# Patient Record
Sex: Female | Born: 1955 | Race: Asian | Hispanic: No | Marital: Married | Smoking: Never smoker
Health system: Southern US, Community
[De-identification: ages and names within clinical notes are randomized; demographics above are authoritative.]

## PROBLEM LIST (undated history)

## (undated) DIAGNOSIS — E237 Disorder of pituitary gland, unspecified: Secondary | ICD-10-CM

## (undated) DIAGNOSIS — E042 Nontoxic multinodular goiter: Secondary | ICD-10-CM

## (undated) DIAGNOSIS — K589 Irritable bowel syndrome without diarrhea: Secondary | ICD-10-CM

## (undated) DIAGNOSIS — B9681 Helicobacter pylori [H. pylori] as the cause of diseases classified elsewhere: Secondary | ICD-10-CM

## (undated) DIAGNOSIS — K2970 Gastritis, unspecified, without bleeding: Secondary | ICD-10-CM

## (undated) HISTORY — DX: Gastritis, unspecified, without bleeding: K29.70

## (undated) HISTORY — PX: APPENDECTOMY: SHX54

## (undated) HISTORY — DX: Disorder of pituitary gland, unspecified: E23.7

## (undated) HISTORY — DX: Irritable bowel syndrome, unspecified: K58.9

## (undated) HISTORY — DX: Nontoxic multinodular goiter: E04.2

## (undated) HISTORY — DX: Helicobacter pylori (H. pylori) as the cause of diseases classified elsewhere: B96.81

---

## 1988-07-24 DIAGNOSIS — E237 Disorder of pituitary gland, unspecified: Secondary | ICD-10-CM

## 1988-07-24 HISTORY — DX: Disorder of pituitary gland, unspecified: E23.7

## 1998-11-24 ENCOUNTER — Ambulatory Visit (HOSPITAL_COMMUNITY): Admission: RE | Admit: 1998-11-24 | Discharge: 1998-11-24 | Payer: Self-pay | Admitting: Gastroenterology

## 1999-07-25 DIAGNOSIS — B9681 Helicobacter pylori [H. pylori] as the cause of diseases classified elsewhere: Secondary | ICD-10-CM

## 1999-07-25 DIAGNOSIS — E042 Nontoxic multinodular goiter: Secondary | ICD-10-CM

## 1999-07-25 HISTORY — DX: Helicobacter pylori (H. pylori) as the cause of diseases classified elsewhere: B96.81

## 1999-07-25 HISTORY — DX: Nontoxic multinodular goiter: E04.2

## 1999-08-08 ENCOUNTER — Other Ambulatory Visit: Admission: RE | Admit: 1999-08-08 | Discharge: 1999-08-08 | Payer: Self-pay | Admitting: Obstetrics and Gynecology

## 1999-09-20 ENCOUNTER — Ambulatory Visit (HOSPITAL_COMMUNITY): Admission: RE | Admit: 1999-09-20 | Discharge: 1999-09-20 | Payer: Self-pay | Admitting: *Deleted

## 1999-10-06 ENCOUNTER — Ambulatory Visit (HOSPITAL_COMMUNITY): Admission: RE | Admit: 1999-10-06 | Discharge: 1999-10-06 | Payer: Self-pay | Admitting: *Deleted

## 1999-10-06 ENCOUNTER — Encounter (INDEPENDENT_AMBULATORY_CARE_PROVIDER_SITE_OTHER): Payer: Self-pay | Admitting: *Deleted

## 2000-07-24 HISTORY — PX: THYROIDECTOMY: SHX17

## 2000-10-26 ENCOUNTER — Other Ambulatory Visit: Admission: RE | Admit: 2000-10-26 | Discharge: 2000-10-26 | Payer: Self-pay | Admitting: Obstetrics and Gynecology

## 2000-12-24 ENCOUNTER — Encounter: Payer: Self-pay | Admitting: Surgery

## 2000-12-25 ENCOUNTER — Encounter (INDEPENDENT_AMBULATORY_CARE_PROVIDER_SITE_OTHER): Payer: Self-pay

## 2000-12-25 ENCOUNTER — Inpatient Hospital Stay (HOSPITAL_COMMUNITY): Admission: RE | Admit: 2000-12-25 | Discharge: 2000-12-26 | Payer: Self-pay | Admitting: Surgery

## 2001-03-26 ENCOUNTER — Ambulatory Visit (HOSPITAL_COMMUNITY): Admission: RE | Admit: 2001-03-26 | Discharge: 2001-03-26 | Payer: Self-pay | Admitting: Internal Medicine

## 2001-03-26 ENCOUNTER — Encounter: Payer: Self-pay | Admitting: Internal Medicine

## 2001-10-28 ENCOUNTER — Other Ambulatory Visit: Admission: RE | Admit: 2001-10-28 | Discharge: 2001-10-28 | Payer: Self-pay | Admitting: Obstetrics and Gynecology

## 2001-11-04 ENCOUNTER — Encounter: Payer: Self-pay | Admitting: Obstetrics and Gynecology

## 2001-11-04 ENCOUNTER — Ambulatory Visit (HOSPITAL_COMMUNITY): Admission: RE | Admit: 2001-11-04 | Discharge: 2001-11-04 | Payer: Self-pay | Admitting: Obstetrics and Gynecology

## 2004-07-07 ENCOUNTER — Ambulatory Visit (HOSPITAL_COMMUNITY): Admission: RE | Admit: 2004-07-07 | Discharge: 2004-07-07 | Payer: Self-pay | Admitting: Obstetrics and Gynecology

## 2005-05-29 ENCOUNTER — Ambulatory Visit: Payer: Self-pay | Admitting: Internal Medicine

## 2005-12-13 ENCOUNTER — Ambulatory Visit: Payer: Self-pay | Admitting: Internal Medicine

## 2005-12-21 ENCOUNTER — Ambulatory Visit: Payer: Self-pay | Admitting: Internal Medicine

## 2006-07-26 ENCOUNTER — Ambulatory Visit: Payer: Self-pay | Admitting: Internal Medicine

## 2006-07-26 LAB — CONVERTED CEMR LAB: TSH: 0.04 microintl units/mL — ABNORMAL LOW (ref 0.35–5.50)

## 2007-03-04 ENCOUNTER — Ambulatory Visit: Payer: Self-pay | Admitting: Internal Medicine

## 2007-03-04 DIAGNOSIS — Z862 Personal history of diseases of the blood and blood-forming organs and certain disorders involving the immune mechanism: Secondary | ICD-10-CM

## 2007-03-04 DIAGNOSIS — Z8639 Personal history of other endocrine, nutritional and metabolic disease: Secondary | ICD-10-CM

## 2007-03-06 ENCOUNTER — Encounter: Payer: Self-pay | Admitting: Internal Medicine

## 2007-03-06 LAB — CONVERTED CEMR LAB: TSH: 0.03 microintl units/mL — ABNORMAL LOW (ref 0.35–5.50)

## 2007-03-07 ENCOUNTER — Encounter (INDEPENDENT_AMBULATORY_CARE_PROVIDER_SITE_OTHER): Payer: Self-pay | Admitting: *Deleted

## 2007-08-01 ENCOUNTER — Telehealth (INDEPENDENT_AMBULATORY_CARE_PROVIDER_SITE_OTHER): Payer: Self-pay | Admitting: *Deleted

## 2007-10-10 ENCOUNTER — Ambulatory Visit: Payer: Self-pay | Admitting: Internal Medicine

## 2007-10-10 DIAGNOSIS — K589 Irritable bowel syndrome without diarrhea: Secondary | ICD-10-CM | POA: Insufficient documentation

## 2007-10-11 ENCOUNTER — Encounter (INDEPENDENT_AMBULATORY_CARE_PROVIDER_SITE_OTHER): Payer: Self-pay | Admitting: *Deleted

## 2007-10-11 ENCOUNTER — Telehealth (INDEPENDENT_AMBULATORY_CARE_PROVIDER_SITE_OTHER): Payer: Self-pay | Admitting: *Deleted

## 2007-10-14 ENCOUNTER — Telehealth (INDEPENDENT_AMBULATORY_CARE_PROVIDER_SITE_OTHER): Payer: Self-pay | Admitting: *Deleted

## 2007-10-15 ENCOUNTER — Encounter (INDEPENDENT_AMBULATORY_CARE_PROVIDER_SITE_OTHER): Payer: Self-pay | Admitting: *Deleted

## 2007-10-15 ENCOUNTER — Ambulatory Visit: Payer: Self-pay | Admitting: Internal Medicine

## 2007-10-15 LAB — CONVERTED CEMR LAB
OCCULT 1: NEGATIVE
OCCULT 2: NEGATIVE
OCCULT 3: NEGATIVE

## 2008-07-24 HISTORY — PX: COLONOSCOPY W/ POLYPECTOMY: SHX1380

## 2009-03-04 ENCOUNTER — Ambulatory Visit: Payer: Self-pay | Admitting: Internal Medicine

## 2009-03-04 ENCOUNTER — Encounter (INDEPENDENT_AMBULATORY_CARE_PROVIDER_SITE_OTHER): Payer: Self-pay | Admitting: *Deleted

## 2009-03-04 DIAGNOSIS — N959 Unspecified menopausal and perimenopausal disorder: Secondary | ICD-10-CM | POA: Insufficient documentation

## 2009-03-04 DIAGNOSIS — E236 Other disorders of pituitary gland: Secondary | ICD-10-CM | POA: Insufficient documentation

## 2009-03-06 LAB — CONVERTED CEMR LAB: Vit D, 25-Hydroxy: 22 ng/mL — ABNORMAL LOW (ref 30–89)

## 2009-03-10 ENCOUNTER — Telehealth (INDEPENDENT_AMBULATORY_CARE_PROVIDER_SITE_OTHER): Payer: Self-pay | Admitting: *Deleted

## 2009-03-10 ENCOUNTER — Encounter (INDEPENDENT_AMBULATORY_CARE_PROVIDER_SITE_OTHER): Payer: Self-pay | Admitting: *Deleted

## 2009-03-12 ENCOUNTER — Ambulatory Visit: Payer: Self-pay | Admitting: Internal Medicine

## 2009-03-17 ENCOUNTER — Ambulatory Visit: Payer: Self-pay | Admitting: Internal Medicine

## 2009-03-17 LAB — CONVERTED CEMR LAB: OCCULT 1: NEGATIVE

## 2009-03-18 ENCOUNTER — Encounter (INDEPENDENT_AMBULATORY_CARE_PROVIDER_SITE_OTHER): Payer: Self-pay | Admitting: *Deleted

## 2009-07-13 ENCOUNTER — Ambulatory Visit: Payer: Self-pay | Admitting: Internal Medicine

## 2009-07-14 ENCOUNTER — Encounter (INDEPENDENT_AMBULATORY_CARE_PROVIDER_SITE_OTHER): Payer: Self-pay | Admitting: *Deleted

## 2009-07-14 ENCOUNTER — Telehealth (INDEPENDENT_AMBULATORY_CARE_PROVIDER_SITE_OTHER): Payer: Self-pay | Admitting: *Deleted

## 2009-07-14 LAB — CONVERTED CEMR LAB: TSH: 0.02 microintl units/mL — ABNORMAL LOW (ref 0.35–5.50)

## 2010-02-14 ENCOUNTER — Encounter (INDEPENDENT_AMBULATORY_CARE_PROVIDER_SITE_OTHER): Payer: Self-pay | Admitting: *Deleted

## 2010-03-09 ENCOUNTER — Telehealth (INDEPENDENT_AMBULATORY_CARE_PROVIDER_SITE_OTHER): Payer: Self-pay | Admitting: *Deleted

## 2010-03-10 ENCOUNTER — Ambulatory Visit: Payer: Self-pay | Admitting: Internal Medicine

## 2010-03-17 ENCOUNTER — Ambulatory Visit: Payer: Self-pay | Admitting: Internal Medicine

## 2010-03-17 DIAGNOSIS — E559 Vitamin D deficiency, unspecified: Secondary | ICD-10-CM | POA: Insufficient documentation

## 2010-03-17 DIAGNOSIS — M8580 Other specified disorders of bone density and structure, unspecified site: Secondary | ICD-10-CM

## 2010-03-17 DIAGNOSIS — Z86010 Personal history of colonic polyps: Secondary | ICD-10-CM

## 2010-03-17 DIAGNOSIS — E039 Hypothyroidism, unspecified: Secondary | ICD-10-CM | POA: Insufficient documentation

## 2010-03-18 LAB — CONVERTED CEMR LAB
BUN: 13 mg/dL (ref 6–23)
CO2: 30 meq/L (ref 19–32)
Chloride: 102 meq/L (ref 96–112)
Creatinine, Ser: 0.6 mg/dL (ref 0.4–1.2)
Potassium: 4.4 meq/L (ref 3.5–5.1)
Vit D, 25-Hydroxy: 69 ng/mL (ref 30–89)

## 2010-06-14 ENCOUNTER — Ambulatory Visit: Payer: Self-pay | Admitting: Internal Medicine

## 2010-06-20 LAB — CONVERTED CEMR LAB
Cholesterol: 230 mg/dL — ABNORMAL HIGH (ref 0–200)
HDL: 55.5 mg/dL (ref >39.00)
Total CHOL/HDL Ratio: 4
VLDL: 39 mg/dL (ref 0.0–40.0)
Vit D, 25-Hydroxy: 62 ng/mL (ref 30–89)

## 2010-08-21 LAB — CONVERTED CEMR LAB
ALT: 14 units/L (ref 0–35)
AST: 18 units/L (ref 0–37)
Albumin: 4.3 g/dL (ref 3.5–5.2)
Alkaline Phosphatase: 80 units/L (ref 39–117)
BUN: 9 mg/dL (ref 6–23)
Basophils Absolute: 0 10*3/uL (ref 0.0–0.1)
Basophils Relative: 0.5 % (ref 0.0–1.0)
Bilirubin, Direct: 0.1 mg/dL (ref 0.0–0.3)
CO2: 32 meq/L (ref 19–32)
Calcium: 9.4 mg/dL (ref 8.4–10.5)
Chloride: 103 meq/L (ref 96–112)
Cholesterol: 203 mg/dL (ref 0–200)
Creatinine, Ser: 0.7 mg/dL (ref 0.4–1.2)
Direct LDL: 112.8 mg/dL
Eosinophils Absolute: 0 10*3/uL (ref 0.0–0.6)
Eosinophils Relative: 0.8 % (ref 0.0–5.0)
GFR calc Af Amer: 113 mL/min
GFR calc non Af Amer: 93 mL/min
Glucose, Bld: 91 mg/dL (ref 70–99)
HCT: 43.7 % (ref 36.0–46.0)
HDL: 57.5 mg/dL (ref >39.0)
Hemoglobin: 14.3 g/dL (ref 12.0–15.0)
Lymphocytes Relative: 35.8 % (ref 12.0–46.0)
MCHC: 32.8 g/dL (ref 30.0–36.0)
MCV: 94.1 fL (ref 78.0–100.0)
Monocytes Absolute: 0.4 10*3/uL (ref 0.2–0.7)
Monocytes Relative: 6.3 % (ref 3.0–11.0)
Neutro Abs: 3.5 10*3/uL (ref 1.4–7.7)
Neutrophils Relative %: 56.6 % (ref 43.0–77.0)
Platelets: 215 10*3/uL (ref 150–400)
Potassium: 3.9 meq/L (ref 3.5–5.1)
RBC: 4.65 M/uL (ref 3.87–5.11)
RDW: 12.1 % (ref 11.5–14.6)
Sodium: 141 meq/L (ref 135–145)
TSH: 0.69 microintl units/mL (ref 0.35–5.50)
Total Bilirubin: 0.8 mg/dL (ref 0.3–1.2)
Total CHOL/HDL Ratio: 3.5
Total Protein: 7.3 g/dL (ref 6.0–8.3)
Triglycerides: 145 mg/dL (ref 0–149)
VLDL: 29 mg/dL (ref 0–40)
WBC: 6.1 10*3/uL (ref 4.5–10.5)

## 2010-08-25 NOTE — Progress Notes (Signed)
Summary: Labs  Phone Note Call from Patient Call back at Home Phone [phone]   Summary of Call: Patient son called and scheduled TSH testing in the lab at 9 am tomorrow. Are there any other labs I should add on for this patient? Please advise.  Initial call taken by: Lucious Groves CMA,  March 09, 2010 3:31 PM  Follow-up for Phone Call        vit D level, CBC & dif, BMET, lipids, hepatic panel: 244.9,995.20, IBS,268.9 Follow-up by: Marga Melnick MD,  March 10, 2010 1:26 PM  Additional Follow-up for Phone Call Additional follow up Details #1::        Rene Kocher told me that the patient was already gone by the time phone note was answered. Would you like for me to have the patient come back or do you prefer to draw at next office visit Please advise. Additional Follow-up by: Lucious Groves CMA,  March 11, 2010 8:47 AM    Additional Follow-up for Phone Call Additional follow up Details #2::    can be drawn @ next visit Follow-up by: Marga Melnick MD,  March 11, 2010 12:53 PM

## 2010-08-25 NOTE — Assessment & Plan Note (Signed)
Summary: REVIEW MEDS//[facility]   Vital Signs:  Patient profile:   55 year old female Weight:      129.4 pounds BMI:     23.01 Pulse rate:   60 / minute Resp:     12 per minute BP sitting:   104 / 62  (left arm) Cuff size:   regular  Vitals Entered By: Shonna Chock CMA (March 17, 2010 9:09 AM) CC: 1.) Follow-up on labs  2.) Leg concerns x 1 month   CC:  1.) Follow-up on labs  2.) Leg concerns x 1 month.  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 Risk for Osteopenia also discussed. BMD 02/2009 reviewed : T score -1.2 @ R femoral neck. Vit D level was 22. She is on Calcium 1500 mg daily & vit D 100,000 International Units once daily . No fractures to date. Walking 3-4X/week for 30-40 min. Labs 09/2007 reviewed ; no significant abnormalities (HDL 57).  Current Medications (verified): 1)  Levoxyl 100 Mcg  Tabs (Levothyroxine Sodium) .Marland Kitchen.. 1 By Mouth Once Daily Except 1 & 1//2 Tab On Tues,th ,& Sat **appointment Due** 2)  Librax 2.5-5 Mg  Caps (Clidinium-Chlordiazepoxide) .... As Needed Only 3)  Vitamin D3 50000 Unit Caps (Cholecalciferol) .Marland Kitchen.. 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 Eyes:  Denies blurring, double vision, and vision loss-both eyes. ENT:  Denies difficulty swallowing and hoarseness. CV:  Denies palpitations. GI:  Denies abdominal  pain, bloody stools, constipation, dark tarry stools, and diarrhea. MS:  Complains of joint pain; denies joint redness, joint swelling, low back pain, mid back pain, and thoracic pain; knees hurt ; no Rx. Derm:  Complains of hair loss; denies changes in nail beds and dryness. Neuro:  Denies numbness and tingling. Endo:  Denies cold intolerance and heat intolerance; Mammograms done in [place].  Physical Exam  General:  Thin but well-nourished; alert,appropriate and cooperative throughout examination Eyes:  No corneal or conjunctival inflammation noted.No lid lag Neck:  No deformities, masses, or tenderness noted. Post op changes w/o nodules Lungs:  Normal respiratory effort, chest expands symmetrically. Lungs are clear to auscultation, no crackles or wheezes. Heart:  regular rhythm, no murmur, no gallop, no rub, no JVD, no HJR, and bradycardia.   Pulses:  R and L carotid,radial,dorsalis pedis and posterior tibial pulses are full and equal bilaterally Extremities:  No clubbing, cyanosis, edema. Single nail deformity  RUE. No knee effusion or crepitus Neurologic:  alert ; DTRs symmetrical and normal.  No tremor Skin:  Intact without suspicious lesions or rashes Cervical Nodes:  No lymphadenopathy noted Axillary Nodes:  No palpable lymphadenopathy Psych:  normally interactive, good eye contact, and not anxious appearing.     Impression & Recommendations:  Problem # 1:  HYPOTHYROIDISM (ICD-244.9)  S/P thyroidectomy for Hurthle Cell changes Her updated medication list for this problem includes:    Levoxyl 100 Mcg Tabs (Levothyroxine  sodium) .Marland Kitchen... 1 by mouth once daily except 1 & 1//2 tab on tues & sat  Orders: Venipuncture (04540) TLB-BMP (Basic Metabolic Panel-BMET) (80048-METABOL)  Problem # 2:  OSTEOPENIA (ICD-733.90)  Orders: Venipuncture (98119) TLB-BMP (Basic Metabolic Panel-BMET) (80048-METABOL) T-Vitamin D (25-Hydroxy) (14782-95621)  Problem # 3:  VITAMIN D DEFICIENCY  (ICD-268.9)  Orders: Venipuncture (30865) T-Vitamin D (25-Hydroxy) (78469-62952)  Complete Medication List: 1)  Levoxyl 100 Mcg Tabs (Levothyroxine sodium) .Marland Kitchen.. 1 by mouth once daily except 1 & 1//2 tab on tues & sat 2)  Librax 2.5-5 Mg Caps (Clidinium-chlordiazepoxide) .... As needed only 3)  Vitamin D3 50000 Unit Caps (cholecalciferol)  .Marland Kitchen.. 1 pill once weekly  Patient Instructions: 1)   BMD every 25 months .Please schedule a follow-up appointment in 3 months.Vit D level ,268.9. 2)  Lipid Panel prior to visit, ICD-9:244.9 3)  TSH prior to visit, ICD-9:244.9 Prescriptions: LEVOXYL 100 MCG  TABS (LEVOTHYROXINE SODIUM) 1 by mouth once daily except 1 & 1//2 tab on Tues & Sat  #90 x 1   Entered and Authorized by:   Marga Melnick MD   Signed by:   Marga Melnick MD on 03/17/2010   Method used:   Print then Give to Patient   RxID:   [id number]   Appended Document: REVIEW MEDS//[facility]

## 2010-08-25 NOTE — Letter (Signed)
Summary: Primary Care Appointment Letter  [facility] [place]  [address] [place], [place] [address]   Phone: [phone]  Fax: [phone]    02/14/2010 MRN: [id number]  [name] [address] [place], [place]  [address]  Dear Ms. Waylan Boga,   Your Primary Care Physician Marga Melnick MD has indicated that:    _______it is time to schedule an appointment.    _______you missed your appointment on______ and need to call and          reschedule.    _______you need to have lab work done.    _____X__you need to schedule an appointment discuss lab or test results (TSH is too low ; please see me with all medications).    _______you need to call to reschedule your appointment that is                       scheduled on _________.     Please call our office as soon as possible. Our phone number is [phone]. Please press option 0. Our office is open 8a-5p, Monday through Friday.     Thank you,    [facility]

## 2010-10-10 ENCOUNTER — Telehealth: Payer: Self-pay

## 2010-10-20 NOTE — Progress Notes (Signed)
Summary: Levoxyl refill  Phone Note Refill Request Message from:  Patient's son Gwenith Spitz = [phone]  Refills Requested: Medication #1:  LEVOXYL 100 MCG  TABS 1 by mouth once daily except 1 & 1//2 tab on Tues & Sat [facility] [place] or [place], [place]     Next Appointment Scheduled: 5/17    = CPX  ==   [place] Initial call taken by: Jerolyn Shin,  October 10, 2010 3:25 PM    Prescriptions: LEVOXYL 100 MCG  TABS (LEVOTHYROXINE SODIUM) 1 by mouth once daily except 1 & 1//2 tab on Tues & Sat  #90 Tablet x 0   Entered by:   Shonna Chock CMA   Authorized by:   Marga Melnick MD   Signed by:   Shonna Chock CMA on 10/10/2010   Method used:   [facility]        [facility]  [place] (retail)       [address]       [place], [place]  [address]       Ph: [phone] or [phone]       Fax: [phone]   RxID:   [id number]

## 2010-12-06 ENCOUNTER — Encounter: Payer: Self-pay | Admitting: Internal Medicine

## 2010-12-08 ENCOUNTER — Ambulatory Visit (INDEPENDENT_AMBULATORY_CARE_PROVIDER_SITE_OTHER): Payer: Self-pay | Admitting: Internal Medicine

## 2010-12-08 ENCOUNTER — Encounter: Payer: Self-pay | Admitting: Internal Medicine

## 2010-12-08 DIAGNOSIS — K589 Irritable bowel syndrome without diarrhea: Secondary | ICD-10-CM

## 2010-12-08 DIAGNOSIS — Z860100 Personal history of colon polyps, unspecified: Secondary | ICD-10-CM

## 2010-12-08 DIAGNOSIS — E559 Vitamin D deficiency, unspecified: Secondary | ICD-10-CM

## 2010-12-08 DIAGNOSIS — M8580 Other specified disorders of bone density and structure, unspecified site: Secondary | ICD-10-CM

## 2010-12-08 DIAGNOSIS — E039 Hypothyroidism, unspecified: Secondary | ICD-10-CM

## 2010-12-08 DIAGNOSIS — Z8639 Personal history of other endocrine, nutritional and metabolic disease: Secondary | ICD-10-CM

## 2010-12-08 DIAGNOSIS — Z862 Personal history of diseases of the blood and blood-forming organs and certain disorders involving the immune mechanism: Secondary | ICD-10-CM

## 2010-12-08 DIAGNOSIS — M899 Disorder of bone, unspecified: Secondary | ICD-10-CM

## 2010-12-08 MED ORDER — LEVOTHYROXINE SODIUM 100 MCG PO TABS: 100.0000 ug | ORAL_TABLET | ORAL | Status: DC

## 2010-12-08 MED ORDER — CILIDINIUM-CHLORDIAZEPOXIDE 2.5-5 MG PO CAPS: 1.0000 | ORAL_CAPSULE | Freq: Three times a day (TID) | ORAL | Status: DC | PRN

## 2010-12-08 NOTE — Progress Notes (Signed)
Subjective:    Patient ID: [name], female    DOB: [birth year], 55 y.o.   MRN: [id number]  HPI Thyroid function monitor  Medications status(change in dose/brand/mode of administration):no Constitutional: Weight change: stable; Fatigue:no; Sleep pattern:no issues; Appetite:good  Visual change(blurred/diplopia/visual loss):no Hoarseness:no; Swallowing issues:no Cardiovascular: Palpitations:no; Racing:no; Irregularity:no GI: Constipation:no; Diarrhea:no Derm: Change in nails/hair/skin: hair thinning Neuro: Numbness/tingling:no; Tremor:no Psych: Anxiety:no; Depression:no; Panic attacks:no Endo: Temperature intolerance: Heat:no; Cold:no  Significant past medical history includes thyroidectomy by Dr. 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 Her TSH goal would be less than 0.3. He  Problematic is her history of osteopenia and vitamin D deficiency.  Additionally she's had an enlarged pituitary sella; this was evaluated by Dr. Newell Coral, neurosurgeon in 1990.  Apparently she had a colonoscopy in [place] in 2011. Unfortunately the CD is not compatible with our computer.      Review of Systems     Objective:   Physical Exam Gen.: Healthy and well-nourished in appearance. Alert, appropriate and cooperative throughout exam. Head: Normocephalic without obvious abnormalities;  no alopecia  Eyes: No corneal or conjunctival inflammation noted. Pupils equal round reactive to light and accommodation. Fundal exam is benign without hemorrhages, exudate, papilledema. Extraocular motion intact.Field of  Vision grossly normal. Ears: External  ear exam reveals no significant lesions or deformities. Canals clear .TMs normal. Hearing is grossly normal bilaterally. Nose: External nasal exam reveals no deformity or inflammation. Nasal mucosa are pink and moist. No lesions or exudates noted.  Mouth: Oral mucosa and oropharynx reveal no lesions or exudates. Teeth in good  repair. Neck: No deformities, masses, or tenderness noted. Thyroid  Surgically absent. Lungs: Normal respiratory effort; chest expands symmetrically. Lungs are clear to auscultation without rales, wheezes, or increased work of breathing. Heart: Normal rate and rhythm. Normal S1 and S2. No gallop, click, or rub. No shifting murmur. Abdomen: Bowel sounds normal; abdomen soft and nontender. No masses, organomegaly or hernias noted. Genitalia: Dr Genice Rouge ,[place].                                                                                                                                    Musculoskeletal/extremities: No deformity or scoliosis noted of  the thoracic or lumbar spine. No clubbing, cyanosis, edema, or deformity noted. Range of motion  normal .Tone & strength  normal.Joints normal.R 3rd fingernail deformed Vascular: Carotid, radial artery, dorsalis pedis and dorsalis posterior tibial pulses are full and equal. No bruits present. Neurologic: Alert and oriented x3. Deep tendon reflexes symmetrical and normal.  Skin: Intact without suspicious lesions or rashes. Lymph: No cervical, axillary, or inguinal lymphadenopathy present. Psych: Mood and affect are normal. Normally interactive but language barrier inhibits history.                                                                                       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 She is asymptomatic at this time. Field of vision is normal  #3 history of osteopenia and vitamin D deficiency  #4 IBS; colon polyps , PMH of  Plan: #1 TSH; all equal less than 0.3  #2 vitamin D level; bone mineral density is due every 25 months  #3 CBC and differential

## 2010-12-08 NOTE — Patient Instructions (Signed)
Recommended lifestyle interventions for Osteopenia include calcium 600 mg twice a day  & vitamin D3 supplementation to keep vit D  level @ least 40-60. The usual vitamin D3 dose is 1000 IU daily; but individual dose is determined by annual vitamin D level monitor. Also weight bearing exercise such as  walking 30-45 minutes 3-4  X per week is recommended.Please schedule a Bone density Study in 03/2011. [employer]

## 2010-12-08 NOTE — Assessment & Plan Note (Signed)
T score -1.2 @ R femoral neck 03/12/2009

## 2010-12-09 LAB — BASIC METABOLIC PANEL
Calcium: 9.3 mg/dL (ref 8.4–10.5)
GFR: 116.51 mL/min (ref >60.00)
Sodium: 142 mEq/L (ref 135–145)

## 2010-12-09 LAB — VITAMIN D 25 HYDROXY (VIT D DEFICIENCY, FRACTURES): Vit D, 25-Hydroxy: 45 ng/mL (ref 30–89)

## 2010-12-09 LAB — CBC WITH DIFFERENTIAL/PLATELET
Basophils Absolute: 0 10*3/uL (ref 0.0–0.1)
Basophils Relative: 0.3 % (ref 0.0–3.0)
Hemoglobin: 13.2 g/dL (ref 12.0–15.0)
Lymphocytes Relative: 36.7 % (ref 12.0–46.0)
Monocytes Relative: 5.6 % (ref 3.0–12.0)
Neutro Abs: 3.7 10*3/uL (ref 1.4–7.7)
RBC: 4.23 Mil/uL (ref 3.87–5.11)
RDW: 13 % (ref 11.5–14.6)

## 2010-12-09 NOTE — Op Note (Signed)
[facility]  Byrd:    [name]                        MRN: [id number] Proc. Date: 12/25/00 Adm. Date:  [id number] Attending:  Bonnetta Barry CC:         Fritzi Mandes, M.D.  Cynthia P. Ashley Royalty, M.D.  Titus Dubin. Alwyn Ren, M.D. [facility]   Operative Report  PREOPERATIVE DIAGNOSIS:   Thyroid goiter with dominant right-sided nodule with Hurthle cell change.  POSTOPERATIVE DIAGNOSIS:  Multinodular goiter with dominant right-sided nodule.  PROCEDURE:  Total thyroidectomy.  SURGEON:  Velora Heckler, M.D.  ASSISTANT:  Gita Kudo, M.D.  ANESTHESIA:  General.  ESTIMATED BLOOD LOSS:  Minimal.  PREPARATION:  Betadine.  COMPLICATIONS:  None.  INDICATIONS:  [name] is a 55 year old [place] female, who presents with thyroid goiter and a dominant right-sided nodule.  This was found initially by Dr. Altamese Dilling.  She was referred to Dr. Mertie Clause.  Ultrasound was performed showing a 2.8 cm dominant solid nodule on Donna right lobe.  Other nodules were seen scattered throughout Donna gland.  Fine-needle aspiration was performed with ultrasound guidance.  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  [name] developed mild compressive symptoms with occasional dysphagia.  She now presents for total thyroidectomy.  DESCRIPTION OF PROCEDURE:  Donna procedure is done in OR #11 at [facility].  [name] is brought to Donna operating room and placed in a supine position on Donna operating room table.  Following Donna administration of general anesthesia, [name] is positioned and then prepped and draped in Donna usual strict aseptic fashion.  After ascertaining an adequate level of anesthesia had been obtained, a standard Kocher incision is made with a #10 blade.  Dissection is carried down through Donna subcutaneous tissues and platysma.  Hemostasis is obtained with Donna  electrocautery.  Skin flaps are developed cephalad and caudad from Donna thyroid notch to Donna sternal notch.  A Mahorner self-retaining retractor is placed for exposure.  Donna strap muscles are incised in Donna midline, and dissection is begun on Donna right side. Strap muscles are reflected laterally.  Donna middle thyroid vein is divided between medium Ligaclips.  Gland is rolled anteriorly.  It contains multiple nodules with a dominant nodule in Donna inferior pole.  On palpation, this feels both cystic and solid in nature.  Inferior vessels are dissected out away from a small tongue of thyroid tissue, tracking along Donna inferior thyroid vein. Donna vessels are divided between small and medium Ligaclips.  Donna gland is rolled anteriorly.  Superior pole is mobilized.  It is divided between hemostats and ligated with 2-0 silk ties as well as medium Ligaclips.  Donna gland is rolled further anterior.  Branches of Donna inferior thyroid artery are divided between small Ligaclips.  Donna gland is mobilized up and onto Donna anterior surface of Donna trachea.  Thyroid tissue is preserved.  Next we turn our attention to Donna left side. Again, strap muscles are reflected laterally.  Donna left lobe is dissected out. It contains multiple subcentimeter nodules.  Donna middle thyroid vein is divided between Ligaclips.  Donna inferior pole vessels are divided between small and medium Ligaclips.  Donna superior pole is mobilized and divided between hemostats and ligated with 2-0 silk ties.  Donna gland is rolled anteriorly.  Both parathyroid glands are identified and preserved.  Recurrent laryngeal nerve is identified  and preserved.  Donna gland is dissected off Donna trachea.  Branches of Donna inferior thyroid artery are divided between small Ligaclips.  Donna gland is completely excised off Donna anterior trachea.  A suture is placed in Donna right inferior pole for orientation purposes.  Donna gland is submitted to pathology for review.  Good  hemostasis is obtained on both sides of Donna neck.  Both sides are irrigated copiously.  Surgicel is placed over Donna area of Donna recurrent laryngeal nerve bilaterally.  Strap muscles are reapproximated in Donna midline with interrupted 3-0 Vicryl sutures. Platysma is reapproximated with interrupted 3-0 Vicryl sutures.  Donna skin is closed with widely spaced stainless steel staples and interspace Benzoin and half-inch Steri-Strips.  Sterile dressings are applied.  [name] is awakened from anesthesia and brought to Donna recovery room in stable condition. [name] tolerated Donna procedure well. DD:  12/25/00 TD:  12/25/00 Job: [id number]

## 2011-01-05 ENCOUNTER — Other Ambulatory Visit: Payer: Self-pay | Admitting: Internal Medicine

## 2011-04-12 ENCOUNTER — Other Ambulatory Visit: Payer: Self-pay | Admitting: Internal Medicine

## 2011-04-12 MED ORDER — LEVOTHYROXINE SODIUM 100 MCG PO TABS: ORAL_TABLET | ORAL | Status: DC

## 2011-04-12 NOTE — Telephone Encounter (Signed)
rx sent

## 2011-07-04 ENCOUNTER — Encounter: Payer: Self-pay | Admitting: Internal Medicine

## 2011-07-04 ENCOUNTER — Ambulatory Visit (INDEPENDENT_AMBULATORY_CARE_PROVIDER_SITE_OTHER): Payer: Self-pay | Admitting: Internal Medicine

## 2011-07-04 DIAGNOSIS — E039 Hypothyroidism, unspecified: Secondary | ICD-10-CM

## 2011-07-04 DIAGNOSIS — K589 Irritable bowel syndrome without diarrhea: Secondary | ICD-10-CM

## 2011-07-04 DIAGNOSIS — E559 Vitamin D deficiency, unspecified: Secondary | ICD-10-CM

## 2011-07-04 DIAGNOSIS — M899 Disorder of bone, unspecified: Secondary | ICD-10-CM

## 2011-07-04 DIAGNOSIS — J209 Acute bronchitis, unspecified: Secondary | ICD-10-CM

## 2011-07-04 DIAGNOSIS — Z86010 Personal history of colonic polyps: Secondary | ICD-10-CM

## 2011-07-04 LAB — CBC WITH DIFFERENTIAL/PLATELET
Basophils Absolute: 0 10*3/uL (ref 0.0–0.1)
Basophils Relative: 0.5 % (ref 0.0–3.0)
Eosinophils Absolute: 0.1 10*3/uL (ref 0.0–0.7)
Eosinophils Relative: 1.1 % (ref 0.0–5.0)
HCT: 39.9 % (ref 36.0–46.0)
Hemoglobin: 13.5 g/dL (ref 12.0–15.0)
Lymphocytes Relative: 35.3 % (ref 12.0–46.0)
Lymphs Abs: 2.8 10*3/uL (ref 0.7–4.0)
MCHC: 33.8 g/dL (ref 30.0–36.0)
MCV: 90.8 fl (ref 78.0–100.0)
Monocytes Absolute: 0.6 10*3/uL (ref 0.1–1.0)
Monocytes Relative: 6.9 % (ref 3.0–12.0)
Neutro Abs: 4.5 10*3/uL (ref 1.4–7.7)
Neutrophils Relative %: 56.2 % (ref 43.0–77.0)
Platelets: 290 10*3/uL (ref 150.0–400.0)
RBC: 4.4 Mil/uL (ref 3.87–5.11)
RDW: 13 % (ref 11.5–14.6)
WBC: 8 10*3/uL (ref 4.5–10.5)

## 2011-07-04 MED ORDER — AZITHROMYCIN 250 MG PO TABS: ORAL_TABLET | ORAL | Status: AC

## 2011-07-04 MED ORDER — CILIDINIUM-CHLORDIAZEPOXIDE 2.5-5 MG PO CAPS: 1.0000 | ORAL_CAPSULE | Freq: Three times a day (TID) | ORAL | Status: DC | PRN

## 2011-07-04 NOTE — Patient Instructions (Signed)
Recommended lifestyle interventions for Osteoporosis include calcium 600 mg twice a day  & vitamin D3 supplementation to keep vit D  level @ least 40-60. The usual vitamin D3 dose is 1000 IU daily; but individual dose is determined by annual vitamin D level monitor. Also weight bearing exercise such as  walking 30-45 minutes 3-4  X per week is recommended. BMD should be monitored every 25 months.

## 2011-07-04 NOTE — Progress Notes (Signed)
  Subjective:    Patient ID: [name], female    DOB: 1955-11-22, 55 y.o.   MRN: [id number]  HPI Thyroid function monitor  Medications status(change in dose/brand/mode of administration): no change in months Constitutional: Weight change: no; Fatigue:recently after RTI; Sleep pattern:good; Appetite:improving as RTI resolving  Visual change(blurred/diplopia/visual loss):no Hoarseness:no; Swallowing issues:no Cardiovascular: Palpitations:no; Racing:no; Irregularity:no GI: Constipation:no; Diarrhea:no Derm: Change in nails/hair/skin:no Neuro: Numbness/tingling:no; Tremor:no Psych: Anxiety:no; Depression:no Endo: Temperature intolerance: Heat:no; Cold:no       Review of Systems   She's had a respiratory infection for approximately 2 weeks. She denies any signs of rhinosinusitis such as frontal headache, facial pain, or nasal purulence. She also denies fever, chills, or sweats. She does have a cough productive of purulent sputum.    Objective:   Physical Exam  Gen.: Thin but well-nourished; in no acute distress Eyes: Extraocular motion intact; no lid lag or proptosis Neck: SQ post op changes w/o nodularity Heart: Normal rhythm and rate without significant murmur, gallop, or extra heart sounds Lungs: Chest clear to auscultation without rales,rales, wheezes Neuro:Deep tendon reflexes are equal and within normal limits; no tremor Lymphatic: No lymphadenopathy is noted about the head, neck, axilla   Skin: Warm and dry without significant lesions or rashes; no onycholysis Psych: Normally communicative and interactive; no abnormal mood or affect clinically.         Assessment & Plan:  #1 hypothyroidism post total thyroidectomy for Hurthle cell changes. Suppressive TSH values needed.  #2 osteopenia which could be exacerbated by #1, her thin habitus, postmenopausal state, and Asian heritage. Bone mineral density should be performed every 25 months. Vitamin D must be kept above 40  at a minimum.  #3 bronchitis acute. Zithromax will be prescribed.

## 2011-07-05 LAB — VITAMIN D 25 HYDROXY (VIT D DEFICIENCY, FRACTURES): Vit D, 25-Hydroxy: 54 ng/mL (ref 30–89)

## 2011-07-05 LAB — TSH: TSH: 0.02 u[IU]/mL — ABNORMAL LOW (ref 0.35–5.50)

## 2011-07-09 ENCOUNTER — Encounter: Payer: Self-pay | Admitting: Internal Medicine

## 2011-12-08 ENCOUNTER — Other Ambulatory Visit: Payer: Self-pay | Admitting: Obstetrics

## 2011-12-08 DIAGNOSIS — Z1231 Encounter for screening mammogram for malignant neoplasm of breast: Secondary | ICD-10-CM

## 2012-01-10 ENCOUNTER — Ambulatory Visit (HOSPITAL_COMMUNITY): Payer: Self-pay | Attending: Obstetrics

## 2012-02-12 ENCOUNTER — Telehealth: Payer: Self-pay | Admitting: Internal Medicine

## 2012-02-12 DIAGNOSIS — R946 Abnormal results of thyroid function studies: Secondary | ICD-10-CM

## 2012-02-12 MED ORDER — LEVOTHYROXINE SODIUM 100 MCG PO TABS: ORAL_TABLET | ORAL | Status: DC

## 2012-02-12 NOTE — Telephone Encounter (Signed)
Refill: Levothyroxine 100 mcg tablet. Take 1 tablet by mouth once daily except take 1 & 1/2 tablet by mouth on tues and sat. Qty 90. Last fill 11-20-11

## 2012-02-12 NOTE — Telephone Encounter (Signed)
RX sent, patient needs to schedule an appointment for labs (future order placed for GJ)

## 2012-03-18 ENCOUNTER — Encounter: Payer: Self-pay | Admitting: Internal Medicine

## 2012-03-18 ENCOUNTER — Ambulatory Visit (INDEPENDENT_AMBULATORY_CARE_PROVIDER_SITE_OTHER): Payer: Self-pay | Admitting: Internal Medicine

## 2012-03-18 VITALS — BP 112/78 | HR 64 | Temp 98.3°F | Resp 12 | Ht 65.0 in | Wt 124.8 lb

## 2012-03-18 DIAGNOSIS — Z23 Encounter for immunization: Secondary | ICD-10-CM

## 2012-03-18 DIAGNOSIS — M949 Disorder of cartilage, unspecified: Secondary | ICD-10-CM

## 2012-03-18 DIAGNOSIS — E039 Hypothyroidism, unspecified: Secondary | ICD-10-CM

## 2012-03-18 DIAGNOSIS — Z0000 Encounter for general adult medical examination without abnormal findings: Secondary | ICD-10-CM

## 2012-03-18 DIAGNOSIS — M899 Disorder of bone, unspecified: Secondary | ICD-10-CM

## 2012-03-18 DIAGNOSIS — E559 Vitamin D deficiency, unspecified: Secondary | ICD-10-CM

## 2012-03-18 LAB — LIPID PANEL
HDL: 60.3 mg/dL (ref >39.00)
LDL Cholesterol: 111 mg/dL — ABNORMAL HIGH (ref 0–99)
Total CHOL/HDL Ratio: 3
Triglycerides: 100 mg/dL (ref 0.0–149.0)

## 2012-03-18 LAB — CBC WITH DIFFERENTIAL/PLATELET
Eosinophils Absolute: 0 10*3/uL (ref 0.0–0.7)
Eosinophils Relative: 0.7 % (ref 0.0–5.0)
Lymphocytes Relative: 37.1 % (ref 12.0–46.0)
MCHC: 32.9 g/dL (ref 30.0–36.0)
MCV: 91.6 fl (ref 78.0–100.0)
Monocytes Absolute: 0.5 10*3/uL (ref 0.1–1.0)
Neutrophils Relative %: 54.7 % (ref 43.0–77.0)
Platelets: 194 10*3/uL (ref 150.0–400.0)
WBC: 6.4 10*3/uL (ref 4.5–10.5)

## 2012-03-18 LAB — HEPATIC FUNCTION PANEL
Bilirubin, Direct: 0.1 mg/dL (ref 0.0–0.3)
Total Bilirubin: 0.8 mg/dL (ref 0.3–1.2)

## 2012-03-18 LAB — BASIC METABOLIC PANEL
BUN: 19 mg/dL (ref 6–23)
Calcium: 9.2 mg/dL (ref 8.4–10.5)
Chloride: 103 mEq/L (ref 96–112)
Creatinine, Ser: 0.7 mg/dL (ref 0.4–1.2)

## 2012-03-18 NOTE — Patient Instructions (Addendum)
Preventive Health Care: Exercise  30-45  minutes a day, 3-4 days a week. Walking is especially valuable in preventing Osteoporosis. Order for Bone density entered into  the computer; these will be performed at [address] [facility]. across from [facility]. The  appointment will be scheduled.  Eat a low-fat diet with lots of fruits and vegetables, up to 7-9 servings per day.  If you activate My Chart; the results can be released to you as soon as they populate from the lab. If you choose not to use this program; the labs have to be reviewed, copied & mailed   causing a delay in getting the results to you.

## 2012-03-18 NOTE — Addendum Note (Signed)
Addended by: Maurice Small on: 03/18/2012 09:25 AM   Modules accepted: Orders

## 2012-03-18 NOTE — Progress Notes (Signed)
Subjective:    Patient ID: [name], female    DOB: Sep 24, 1955, 56 y.o.   MRN: [id number]  HPI  She  is here for a physical; she denies acute health  Issues.      Review of Systems  Patient reports no significant  vision/ hearing  changes, adenopathy,fever, weight change,  persistant / recurrent hoarseness , swallowing issues, chest pain,palpitations,edema,persistant /recurrent cough, hemoptysis, dyspnea( rest/ exertional/paroxysmal nocturnal), gastrointestinal bleeding(melena, rectal bleeding), abdominal pain, significant heartburn,  bowel changes,GU symptoms(dysuria, hematuria,pyuria, incontinence), Gyn symptoms(abnormal  bleeding , pain),  syncope, focal weakness, numbness & tingling, skin/hair /nail changes,abnormal bruising or bleeding, anxiety,or depression.      Objective:   Physical Exam Gen.: Thin but healthy and well-nourished in appearance. Alert, appropriate and cooperative throughout exam. Head: Normocephalic without obvious abnormalities  Eyes: No corneal or conjunctival inflammation noted. Pupils equal round reactive to light and accommodation. Fundal exam is benign without hemorrhages, exudate, papilledema. Extraocular motion intact. Vision grossly normal with lenses. Ears: External  ear exam reveals no significant lesions or deformities. Canals clear .TMs normal. Hearing is grossly normal bilaterally. Nose: External nasal exam reveals no deformity or inflammation. Nasal mucosa are pink and moist. No lesions or exudates noted.  Mouth: Oral mucosa and oropharynx reveal no lesions or exudates. Teeth in good repair. Neck: No deformities, masses, or tenderness noted. Range of motion normal. Thyroid absent. Lungs: Normal respiratory effort; chest expands symmetrically. Lungs are clear to auscultation without rales, wheezes, or increased work of breathing. Heart: Normal rate and rhythm. Normal S1 and S2. No gallop, click, or rub. S4 w/o murmur. Abdomen: Bowel sounds normal;  abdomen soft and nontender. No masses, organomegaly or hernias noted. Genitalia: Gyn seen in [place] every 24 months. She had seen Dr Tresa Res in past   .                                                                                   Musculoskeletal/extremities: No deformity or scoliosis noted of  the thoracic or lumbar spine. No clubbing, cyanosis, edema, or deformity noted. Range of motion  normal .Tone & strength  normal.Joints normal. Nail health  Good; deformed 3rd R fingernail. Vascular: Carotid, radial artery, dorsalis pedis and  posterior tibial pulses are full and equal. No bruits present. Neurologic: Alert and oriented x3. Deep tendon reflexes symmetrical and normal.          Skin: Intact without suspicious lesions or rashes. Lymph: No cervical, axillary lymphadenopathy present. Psych: Mood and affect are normal. Normally interactive                                                                                          Assessment & Plan:  #1 comprehensive physical exam; no acute findings #2 see Problem List with Assessments & Recommendations. Because of  her Asian heritage &suppressive thyroid doses it is imperative to monitor her bone density at least every 25 months. Plan: see Orders

## 2012-03-20 ENCOUNTER — Encounter: Payer: Self-pay | Admitting: Internal Medicine

## 2012-03-22 ENCOUNTER — Encounter: Payer: Self-pay | Admitting: Internal Medicine

## 2012-04-02 ENCOUNTER — Other Ambulatory Visit: Payer: Self-pay | Admitting: Internal Medicine

## 2012-04-08 ENCOUNTER — Inpatient Hospital Stay: Admission: RE | Admit: 2012-04-08 | Payer: Self-pay | Source: Ambulatory Visit

## 2012-09-18 ENCOUNTER — Other Ambulatory Visit: Payer: Self-pay | Admitting: Internal Medicine

## 2012-09-18 NOTE — Telephone Encounter (Signed)
TSH 244.9 

## 2012-10-11 ENCOUNTER — Encounter: Payer: Self-pay | Admitting: Internal Medicine

## 2012-10-11 ENCOUNTER — Ambulatory Visit (INDEPENDENT_AMBULATORY_CARE_PROVIDER_SITE_OTHER): Payer: Self-pay | Admitting: Internal Medicine

## 2012-10-11 VITALS — BP 116/78 | HR 63 | Wt 128.0 lb

## 2012-10-11 DIAGNOSIS — Z86010 Personal history of colonic polyps: Secondary | ICD-10-CM

## 2012-10-11 DIAGNOSIS — M949 Disorder of cartilage, unspecified: Secondary | ICD-10-CM

## 2012-10-11 DIAGNOSIS — E559 Vitamin D deficiency, unspecified: Secondary | ICD-10-CM

## 2012-10-11 DIAGNOSIS — K589 Irritable bowel syndrome without diarrhea: Secondary | ICD-10-CM

## 2012-10-11 DIAGNOSIS — E236 Other disorders of pituitary gland: Secondary | ICD-10-CM

## 2012-10-11 DIAGNOSIS — E039 Hypothyroidism, unspecified: Secondary | ICD-10-CM

## 2012-10-11 MED ORDER — CILIDINIUM-CHLORDIAZEPOXIDE 2.5-5 MG PO CAPS: 1.0000 | ORAL_CAPSULE | Freq: Three times a day (TID) | ORAL | Status: DC | PRN

## 2012-10-11 MED ORDER — LEVOTHYROXINE SODIUM 100 MCG PO TABS: ORAL_TABLET | ORAL | Status: DC

## 2012-10-11 NOTE — Assessment & Plan Note (Signed)
Bone mineral density was ordered 8/13; there is no record that this was completed. It will be reordered.

## 2012-10-11 NOTE — Patient Instructions (Addendum)
Order for Bone Density entered into  the computer; these will be performed at [address] [facility]. across from [facility]. Your  appointment date will be verified. Please take the probiotic , Align, every day to improve  the bowel symptoms. This will replace the normal bacteria which  are necessary for formation of normal stool and processing of food. Review and correct the record as indicated. Please cshare record with all medical staff seen.

## 2012-10-11 NOTE — Assessment & Plan Note (Signed)
The TSH goal will be suppressive because of a past history of Hurthle cell changes. This may have adverse effect on bone density

## 2012-10-11 NOTE — Progress Notes (Signed)
  Subjective:    Patient ID: [name], female    DOB: 07/01/1956, 57 y.o.   MRN: [id number]  HPI Patient is here to monitor thyroid status There has been no change in the dose , brand , or mode of administration of thyroid supplement. 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        Review of Systems Constitutional: No significant change in weight; significant fatigue; sleep disorder; change in appetite. Eye: no blurred, double ,loss of vision Cardiovascular: no palpitations; racing; irregularity ENT/GI: no constipation; diarrhea;hoarseness;dysphagia Derm: no change in nails,hair,skin Neuro: no numbness or tingling; tremor Psych:no anxiety; depression; panic attacks Endo: no temperature intolerance to heat , ? Cold intolerance       Objective:   Physical Exam Gen.: Thin but  well-nourished; in no acute distress.Appears younger than stated age Eyes: Extraocular motion intact; no lid lag or proptosis ,nystagmus Neck: full ROM; no masses ; thyroid normal  Heart: Normal rhythm and rate without significant murmur, gallop, or extra heart sounds. S4 Lungs: Chest clear to auscultation without rales,rales, wheezes Neuro:Deep tendon reflexes are equal and within normal limits; no tremor  Skin: Warm and dry without significant lesions or rashes; no onycholysis.Deformed 3rd R fingernail Lymphatic: no cervical or axillary LA Psych: Normally  interactive; no abnormal mood or affect clinically. Language barriers impact communication        Assessment & Plan:

## 2012-10-11 NOTE — Assessment & Plan Note (Signed)
There is a discrepancy in the history. Her son has stated that colonoscopy and polypectomy was done through [facility] questionably in 2010.  She seems to indicate that this was done in [place] in 2011. I'll ask her to review this with her family to verify status of colonoscopy.

## 2012-10-11 NOTE — Assessment & Plan Note (Signed)
Vitamin D level will be rechecked; it was not @ goal in 8/13.

## 2012-10-15 LAB — VITAMIN D 1,25 DIHYDROXY: Vitamin D2 1, 25 (OH)2: <8 pg/mL

## 2012-10-18 ENCOUNTER — Other Ambulatory Visit (INDEPENDENT_AMBULATORY_CARE_PROVIDER_SITE_OTHER): Payer: Self-pay

## 2012-10-18 ENCOUNTER — Ambulatory Visit (INDEPENDENT_AMBULATORY_CARE_PROVIDER_SITE_OTHER)
Admission: RE | Admit: 2012-10-18 | Discharge: 2012-10-18 | Disposition: A | Discharge status: Home / Self Care | Payer: Self-pay | Source: Ambulatory Visit | Attending: Internal Medicine | Admitting: Internal Medicine

## 2012-10-18 DIAGNOSIS — E559 Vitamin D deficiency, unspecified: Secondary | ICD-10-CM

## 2012-10-18 DIAGNOSIS — M899 Disorder of bone, unspecified: Secondary | ICD-10-CM

## 2012-10-18 DIAGNOSIS — R946 Abnormal results of thyroid function studies: Secondary | ICD-10-CM

## 2012-10-18 DIAGNOSIS — M949 Disorder of cartilage, unspecified: Secondary | ICD-10-CM

## 2013-04-24 ENCOUNTER — Other Ambulatory Visit: Payer: Self-pay | Admitting: Internal Medicine

## 2013-04-24 NOTE — Telephone Encounter (Signed)
Refill done.  

## 2013-08-04 ENCOUNTER — Other Ambulatory Visit: Payer: Self-pay

## 2013-08-04 DIAGNOSIS — Z1231 Encounter for screening mammogram for malignant neoplasm of breast: Secondary | ICD-10-CM

## 2013-08-08 ENCOUNTER — Other Ambulatory Visit: Payer: Self-pay | Admitting: *Deleted

## 2013-08-08 MED ORDER — LEVOTHYROXINE SODIUM 100 MCG PO TABS: ORAL_TABLET | ORAL | Status: DC

## 2013-08-22 ENCOUNTER — Ambulatory Visit
Admission: RE | Admit: 2013-08-22 | Discharge: 2013-08-22 | Disposition: A | Discharge status: Home / Self Care | Payer: Self-pay | Source: Ambulatory Visit

## 2013-08-22 DIAGNOSIS — Z1231 Encounter for screening mammogram for malignant neoplasm of breast: Secondary | ICD-10-CM

## 2013-10-08 ENCOUNTER — Ambulatory Visit (INDEPENDENT_AMBULATORY_CARE_PROVIDER_SITE_OTHER): Payer: Self-pay | Admitting: Internal Medicine

## 2013-10-08 ENCOUNTER — Other Ambulatory Visit (INDEPENDENT_AMBULATORY_CARE_PROVIDER_SITE_OTHER): Payer: Self-pay

## 2013-10-08 ENCOUNTER — Encounter: Payer: Self-pay | Admitting: Internal Medicine

## 2013-10-08 VITALS — BP 120/70 | HR 71 | Temp 97.2°F | Ht 63.5 in | Wt 133.1 lb

## 2013-10-08 DIAGNOSIS — M899 Disorder of bone, unspecified: Secondary | ICD-10-CM

## 2013-10-08 DIAGNOSIS — E039 Hypothyroidism, unspecified: Secondary | ICD-10-CM

## 2013-10-08 DIAGNOSIS — M949 Disorder of cartilage, unspecified: Secondary | ICD-10-CM

## 2013-10-08 DIAGNOSIS — Z0000 Encounter for general adult medical examination without abnormal findings: Secondary | ICD-10-CM

## 2013-10-08 LAB — CBC WITH DIFFERENTIAL/PLATELET
BASOS ABS: 0 10*3/uL (ref 0.0–0.1)
Basophils Relative: 0.5 % (ref 0.0–3.0)
EOS ABS: 0.1 10*3/uL (ref 0.0–0.7)
Eosinophils Relative: 0.8 % (ref 0.0–5.0)
HEMATOCRIT: 41.4 % (ref 36.0–46.0)
HEMOGLOBIN: 13.9 g/dL (ref 12.0–15.0)
LYMPHS ABS: 3.3 10*3/uL (ref 0.7–4.0)
LYMPHS PCT: 47.4 % — AB (ref 12.0–46.0)
MCHC: 33.5 g/dL (ref 30.0–36.0)
MCV: 90.3 fl (ref 78.0–100.0)
MONOS PCT: 6.9 % (ref 3.0–12.0)
Monocytes Absolute: 0.5 10*3/uL (ref 0.1–1.0)
NEUTROS ABS: 3.1 10*3/uL (ref 1.4–7.7)
Neutrophils Relative %: 44.4 % (ref 43.0–77.0)
PLATELETS: 209 10*3/uL (ref 150.0–400.0)
RBC: 4.59 Mil/uL (ref 3.87–5.11)
RDW: 13.2 % (ref 11.5–14.6)
WBC: 6.9 10*3/uL (ref 4.5–10.5)

## 2013-10-08 LAB — BASIC METABOLIC PANEL
BUN: 10 mg/dL (ref 6–23)
CALCIUM: 9.3 mg/dL (ref 8.4–10.5)
CO2: 29 meq/L (ref 19–32)
Chloride: 102 mEq/L (ref 96–112)
Creatinine, Ser: 0.7 mg/dL (ref 0.4–1.2)
GFR: 91.01 mL/min (ref >60.00)
GLUCOSE: 98 mg/dL (ref 70–99)
Potassium: 4.3 mEq/L (ref 3.5–5.1)
SODIUM: 138 meq/L (ref 135–145)

## 2013-10-08 LAB — LIPID PANEL
CHOLESTEROL: 230 mg/dL — AB (ref 0–200)
HDL: 57.6 mg/dL (ref >39.00)
LDL CALC: 138 mg/dL — AB (ref 0–99)
TRIGLYCERIDES: 172 mg/dL — AB (ref 0.0–149.0)
Total CHOL/HDL Ratio: 4
VLDL: 34.4 mg/dL (ref 0.0–40.0)

## 2013-10-08 LAB — HEPATIC FUNCTION PANEL
ALBUMIN: 4.6 g/dL (ref 3.5–5.2)
ALT: 15 U/L (ref 0–35)
AST: 20 U/L (ref 0–37)
Alkaline Phosphatase: 81 U/L (ref 39–117)
BILIRUBIN TOTAL: 0.5 mg/dL (ref 0.3–1.2)
Bilirubin, Direct: 0 mg/dL (ref 0.0–0.3)
Total Protein: 7 g/dL (ref 6.0–8.3)

## 2013-10-08 LAB — TSH: TSH: 0.03 u[IU]/mL — ABNORMAL LOW (ref 0.35–5.50)

## 2013-10-08 MED ORDER — LEVOTHYROXINE SODIUM 100 MCG PO TABS: ORAL_TABLET | ORAL | Status: DC

## 2013-10-08 NOTE — Patient Instructions (Signed)
Your next office appointment will be determined based upon review of your pending labs . Those instructions will be transmitted to you  by mail. 

## 2013-10-08 NOTE — Progress Notes (Signed)
   Subjective:    Patient ID: [name], female    DOB: Dec 17, 1955, 58 y.o.   MRN: [id number]  HPI She  is here for a physical;acute issues denied.     Review of Systems Constitutional: 5 # gain in weight; significant fatigue; sleep disorder; change in appetite. Eye: no blurred, double ,loss of vision Cardiovascular: no palpitations; racing; irregularity ENT/GI: no constipation; diarrhea;hoarseness;dysphagia Derm: no change in nails,hair,skin Neuro: no numbness or tingling; tremor Psych:no anxiety; depression Endo:?  no temperature intolerance to heat ,cold       Objective:   Physical Exam Gen.:  Thin but well-nourished; in no acute distress Eyes: Extraocular motion intact; no lid lag or proptosis ,no nystagmus Neck: full ROM; no masses ; thyroid absent  Heart: Normal rhythm and rate without significant murmur, gallop, or extra heart sounds Lungs: Chest clear to auscultation without rales,rales, wheezes Abdomen: bowel sounds normal, soft and non-tender without masses, organomegaly or hernias noted.  No guarding or rebound GU: as per Gyn Neuro:Deep tendon reflexes are equal and within normal limits; no tremor  Skin: Warm and dry without significant lesions or rashes; no onycholysis. Deformed R 3rd fingernail Lymphatic: no cervical or axillary LA Psych: Normally communicative and interactive; no abnormal mood or affect clinically. Communication issues due to language barriers        Assessment & Plan:  #1 comprehensive physical exam; no acute findings  Plan: see Orders  & Recommendations

## 2013-10-08 NOTE — Progress Notes (Signed)
Pre visit review using our clinic review tool, if applicable. No additional management support is needed unless otherwise documented below in the visit note. 

## 2013-10-12 ENCOUNTER — Encounter: Payer: Self-pay | Admitting: Internal Medicine

## 2013-10-12 LAB — VITAMIN D 1,25 DIHYDROXY
Vitamin D 1, 25 (OH)2 Total: 59 pg/mL (ref 18–72)
Vitamin D2 1, 25 (OH)2: <8 pg/mL
Vitamin D3 1, 25 (OH)2: 59 pg/mL

## 2013-10-15 ENCOUNTER — Encounter: Payer: Self-pay | Admitting: *Deleted

## 2013-11-20 ENCOUNTER — Encounter (HOSPITAL_COMMUNITY): Payer: Self-pay | Admitting: Emergency Medicine

## 2013-11-20 ENCOUNTER — Emergency Department (HOSPITAL_COMMUNITY)
Admission: EM | Admit: 2013-11-20 | Discharge: 2013-11-21 | Disposition: A | Discharge status: Home / Self Care | Payer: No Typology Code available for payment source | Attending: Emergency Medicine | Admitting: Emergency Medicine

## 2013-11-20 DIAGNOSIS — Y92410 Unspecified street and highway as the place of occurrence of the external cause: Secondary | ICD-10-CM | POA: Insufficient documentation

## 2013-11-20 DIAGNOSIS — S199XXA Unspecified injury of neck, initial encounter: Secondary | ICD-10-CM

## 2013-11-20 DIAGNOSIS — Z8619 Personal history of other infectious and parasitic diseases: Secondary | ICD-10-CM | POA: Insufficient documentation

## 2013-11-20 DIAGNOSIS — IMO0002 Reserved for concepts with insufficient information to code with codable children: Secondary | ICD-10-CM | POA: Insufficient documentation

## 2013-11-20 DIAGNOSIS — Z8719 Personal history of other diseases of the digestive system: Secondary | ICD-10-CM | POA: Insufficient documentation

## 2013-11-20 DIAGNOSIS — T148XXA Other injury of unspecified body region, initial encounter: Secondary | ICD-10-CM

## 2013-11-20 DIAGNOSIS — S0990XA Unspecified injury of head, initial encounter: Secondary | ICD-10-CM | POA: Insufficient documentation

## 2013-11-20 DIAGNOSIS — E042 Nontoxic multinodular goiter: Secondary | ICD-10-CM | POA: Insufficient documentation

## 2013-11-20 DIAGNOSIS — Y9389 Activity, other specified: Secondary | ICD-10-CM | POA: Insufficient documentation

## 2013-11-20 DIAGNOSIS — S46819A Strain of other muscles, fascia and tendons at shoulder and upper arm level, unspecified arm, initial encounter: Principal | ICD-10-CM

## 2013-11-20 DIAGNOSIS — R42 Dizziness and giddiness: Secondary | ICD-10-CM | POA: Insufficient documentation

## 2013-11-20 DIAGNOSIS — S0993XA Unspecified injury of face, initial encounter: Secondary | ICD-10-CM | POA: Insufficient documentation

## 2013-11-20 DIAGNOSIS — S43499A Other sprain of unspecified shoulder joint, initial encounter: Secondary | ICD-10-CM | POA: Insufficient documentation

## 2013-11-20 MED ORDER — NAPROXEN 500 MG PO TABS: 500.0000 mg | ORAL_TABLET | Freq: Two times a day (BID) | ORAL | Status: DC

## 2013-11-20 MED ORDER — METHOCARBAMOL 500 MG PO TABS: 500.0000 mg | ORAL_TABLET | Freq: Four times a day (QID) | ORAL | Status: DC | PRN
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
## 2014-09-21 ENCOUNTER — Ambulatory Visit (INDEPENDENT_AMBULATORY_CARE_PROVIDER_SITE_OTHER): Payer: Self-pay | Admitting: Internal Medicine

## 2014-09-21 ENCOUNTER — Other Ambulatory Visit (INDEPENDENT_AMBULATORY_CARE_PROVIDER_SITE_OTHER): Payer: Self-pay

## 2014-09-21 ENCOUNTER — Encounter: Payer: Self-pay | Admitting: Internal Medicine

## 2014-09-21 VITALS — BP 138/90 | HR 77 | Temp 98.0°F | Resp 16 | Ht 64.0 in | Wt 146.0 lb

## 2014-09-21 DIAGNOSIS — Z86010 Personal history of colonic polyps: Secondary | ICD-10-CM

## 2014-09-21 DIAGNOSIS — E890 Postprocedural hypothyroidism: Secondary | ICD-10-CM

## 2014-09-21 DIAGNOSIS — E785 Hyperlipidemia, unspecified: Secondary | ICD-10-CM

## 2014-09-21 DIAGNOSIS — M8580 Other specified disorders of bone density and structure, unspecified site: Secondary | ICD-10-CM

## 2014-09-21 DIAGNOSIS — R1314 Dysphagia, pharyngoesophageal phase: Secondary | ICD-10-CM

## 2014-09-21 LAB — CBC WITH DIFFERENTIAL/PLATELET
BASOS PCT: 0.5 % (ref 0.0–3.0)
Basophils Absolute: 0 10*3/uL (ref 0.0–0.1)
EOS PCT: 0.9 % (ref 0.0–5.0)
Eosinophils Absolute: 0.1 10*3/uL (ref 0.0–0.7)
HEMATOCRIT: 42 % (ref 36.0–46.0)
Hemoglobin: 14.4 g/dL (ref 12.0–15.0)
Lymphocytes Relative: 39.3 % (ref 12.0–46.0)
Lymphs Abs: 3 10*3/uL (ref 0.7–4.0)
MCHC: 34.4 g/dL (ref 30.0–36.0)
MCV: 87.3 fl (ref 78.0–100.0)
MONO ABS: 0.6 10*3/uL (ref 0.1–1.0)
Monocytes Relative: 7.5 % (ref 3.0–12.0)
NEUTROS PCT: 51.8 % (ref 43.0–77.0)
Neutro Abs: 4 10*3/uL (ref 1.4–7.7)
PLATELETS: 244 10*3/uL (ref 150.0–400.0)
RBC: 4.82 Mil/uL (ref 3.87–5.11)
RDW: 13.2 % (ref 11.5–15.5)
WBC: 7.7 10*3/uL (ref 4.0–10.5)

## 2014-09-21 LAB — BASIC METABOLIC PANEL
BUN: 14 mg/dL (ref 6–23)
CO2: 30 meq/L (ref 19–32)
CREATININE: 0.69 mg/dL (ref 0.40–1.20)
Calcium: 9.6 mg/dL (ref 8.4–10.5)
Chloride: 103 mEq/L (ref 96–112)
GFR: 92.23 mL/min (ref >60.00)
GLUCOSE: 98 mg/dL (ref 70–99)
Potassium: 4.3 mEq/L (ref 3.5–5.1)
Sodium: 138 mEq/L (ref 135–145)

## 2014-09-21 LAB — HEPATIC FUNCTION PANEL
ALBUMIN: 4.4 g/dL (ref 3.5–5.2)
ALK PHOS: 82 U/L (ref 39–117)
ALT: 16 U/L (ref 0–35)
AST: 16 U/L (ref 0–37)
Bilirubin, Direct: 0.1 mg/dL (ref 0.0–0.3)
TOTAL PROTEIN: 7.3 g/dL (ref 6.0–8.3)
Total Bilirubin: 0.6 mg/dL (ref 0.2–1.2)

## 2014-09-21 LAB — TSH: TSH: 0.06 u[IU]/mL — ABNORMAL LOW (ref 0.35–4.50)

## 2014-09-21 LAB — LIPID PANEL
CHOL/HDL RATIO: 5
Cholesterol: 235 mg/dL — ABNORMAL HIGH (ref 0–200)
HDL: 52.1 mg/dL (ref >39.00)
LDL Cholesterol: 145 mg/dL — ABNORMAL HIGH (ref 0–99)
NONHDL: 182.9
TRIGLYCERIDES: 192 mg/dL — AB (ref 0.0–149.0)
VLDL: 38.4 mg/dL (ref 0.0–40.0)

## 2014-09-21 LAB — VITAMIN D 25 HYDROXY (VIT D DEFICIENCY, FRACTURES): VITD: 26.94 ng/mL — ABNORMAL LOW (ref 30.00–100.00)

## 2014-09-21 MED ORDER — RANITIDINE HCL 150 MG PO TABS: 150.0000 mg | ORAL_TABLET | Freq: Two times a day (BID) | ORAL | Status: DC

## 2014-09-21 NOTE — Patient Instructions (Signed)
  Your next office appointment will be determined based upon review of your pending labs  Those instructions will be transmitted to you by mail. Critical values will be called. Followup as needed for any active or acute issue. Please report any significant change in your symptoms.  Reflux of gastric acid may be asymptomatic as this may occur mainly during sleep.The triggers for reflux  include stress; the "aspirin family" ; alcohol; peppermint; and caffeine (coffee, tea, cola, and chocolate). The aspirin family would include aspirin and the nonsteroidal agents such as ibuprofen &  Naproxen. Tylenol would not cause reflux. If having symptoms ; food & drink should be avoided for @ least 2 hours before going to bed.

## 2014-09-21 NOTE — Assessment & Plan Note (Signed)
CBC

## 2014-09-21 NOTE — Progress Notes (Signed)
Subjective:    Patient ID: [name], female    DOB: 02-Mar-1956, 59 y.o.   MRN: [id number]  HPI The patient is here to assess status of active health conditions.  She is on an Asian diet. She walks 5 times a week for 60-90 minutes without cardiopulmonary symptoms.  She has been compliant with her thyroid medication. She questions whether the dose is correct as she remains tired and somnolent. She's also had more than 10 pound weight gain.  Her colonoscopy history is unclear. One note states her last colonoscopy was in 2011 in [place]. There is a notation that she had a colonoscopy here in 2010; no operative report is found in the EMR.  She has some some dysphagia daily;she denies other active GI symptoms at this time.  Review of Systems  Chest pain, palpitations, tachycardia, exertional dyspnea, paroxysmal nocturnal dyspnea, claudication or edema are absent.  Unexplained weight loss, abdominal pain, significant dyspepsia,  melena, rectal bleeding, or persistently small caliber stools are denied.     Objective:   Physical Exam  Gen.: Adequately nourished in appearance. Alert, appropriate and cooperative throughout exam. BMI: 25.05 Appears younger than stated age  Head: Normocephalic without obvious abnormalities  Eyes: No corneal or conjunctival inflammation noted. Pupils equal round reactive to light and accommodation. Extraocular motion intact.  Ears: External  ear exam reveals no significant lesions or deformities. Canals clear .TMs normal. Hearing is grossly normal bilaterally. Nose: External nasal exam reveals no deformity or inflammation. Nasal mucosa are pink and moist. No lesions or exudates noted.   Mouth: Oral mucosa and oropharynx reveal no lesions or exudates. Teeth in good repair. Neck: No deformities, masses, or tenderness noted. Range of motion normal.Thyroid absent. Lungs: Normal respiratory effort; chest expands symmetrically. Lungs are clear to auscultation  without rales, wheezes, or increased work of breathing. Heart: Normal rate and rhythm. Normal S1 and S2. No gallop, click, or rub. No murmur. Abdomen: Bowel sounds normal; abdomen soft and nontender. No masses, organomegaly or hernias noted. Genitalia:  as per Gyn                                  Musculoskeletal/extremities: No deformity or scoliosis noted of  the thoracic or lumbar spine.  No clubbing, cyanosis, edema, or significant extremity  deformity noted.  Range of motion normal . Tone & strength normal. Hand joints normal  Fingernail  health good; traumatic deformity 3rd R nail.. Able to lie down & sit up w/o help.  Negative SLR bilaterally Vascular: Carotid, radial artery, dorsalis pedis and  posterior tibial pulses are full and equal. No bruits present. Neurologic: Alert and oriented x3. Deep tendon reflexes symmetrical and normal.  Gait normal     Skin: Intact without suspicious lesions or rashes. Lymph: No cervical, axillary lymphadenopathy present. Psych: Mood and affect are normal. Normally interactive but language is a barrier to communication.                                                                                    Assessment & Plan:  See Current  Assessment & Plan in Problem List under specific Diagnosis

## 2014-09-21 NOTE — Assessment & Plan Note (Addendum)
Vitamin D level BMET

## 2014-09-21 NOTE — Progress Notes (Signed)
Pre visit review using our clinic review tool, if applicable. No additional management support is needed unless otherwise documented below in the visit note. 

## 2014-09-21 NOTE — Assessment & Plan Note (Signed)
TSH 

## 2014-09-22 ENCOUNTER — Other Ambulatory Visit: Payer: Self-pay | Admitting: Internal Medicine

## 2014-09-22 DIAGNOSIS — E890 Postprocedural hypothyroidism: Secondary | ICD-10-CM

## 2014-09-22 DIAGNOSIS — R1314 Dysphagia, pharyngoesophageal phase: Secondary | ICD-10-CM

## 2014-11-24 ENCOUNTER — Other Ambulatory Visit: Payer: Self-pay

## 2014-11-24 NOTE — Telephone Encounter (Signed)
Received a refill request for this medication that is not on the current medication list

## 2014-11-24 NOTE — Telephone Encounter (Signed)
OK X1 

## 2014-11-25 MED ORDER — CILIDINIUM-CHLORDIAZEPOXIDE 2.5-5 MG PO CAPS: ORAL_CAPSULE | ORAL | Status: DC

## 2014-11-25 NOTE — Telephone Encounter (Signed)
1 q 6-8 hrs prn

## 2014-11-25 NOTE — Telephone Encounter (Signed)
Script has been faxed

## 2014-11-25 NOTE — Telephone Encounter (Signed)
What directions?

## 2014-12-20 ENCOUNTER — Other Ambulatory Visit: Payer: Self-pay | Admitting: Internal Medicine

## 2014-12-29 ENCOUNTER — Other Ambulatory Visit: Payer: Self-pay | Admitting: Internal Medicine

## 2015-03-28 ENCOUNTER — Other Ambulatory Visit: Payer: Self-pay | Admitting: Internal Medicine

## 2015-03-31 ENCOUNTER — Other Ambulatory Visit: Payer: Self-pay | Admitting: Emergency Medicine

## 2015-03-31 MED ORDER — LEVOTHYROXINE SODIUM 100 MCG PO TABS: ORAL_TABLET | ORAL | Status: DC

## 2015-07-01 ENCOUNTER — Other Ambulatory Visit: Payer: Self-pay | Admitting: Internal Medicine

## 2015-10-25 ENCOUNTER — Other Ambulatory Visit: Payer: Self-pay | Admitting: Internal Medicine

## 2015-12-09 ENCOUNTER — Other Ambulatory Visit (INDEPENDENT_AMBULATORY_CARE_PROVIDER_SITE_OTHER): Payer: Self-pay

## 2015-12-09 ENCOUNTER — Encounter: Payer: Self-pay | Admitting: Internal Medicine

## 2015-12-09 ENCOUNTER — Ambulatory Visit (INDEPENDENT_AMBULATORY_CARE_PROVIDER_SITE_OTHER): Payer: Self-pay | Admitting: Internal Medicine

## 2015-12-09 VITALS — BP 138/88 | HR 62 | Temp 98.1°F | Resp 16 | Wt 139.0 lb

## 2015-12-09 DIAGNOSIS — M79605 Pain in left leg: Secondary | ICD-10-CM

## 2015-12-09 DIAGNOSIS — E890 Postprocedural hypothyroidism: Secondary | ICD-10-CM

## 2015-12-09 DIAGNOSIS — M79604 Pain in right leg: Secondary | ICD-10-CM

## 2015-12-09 DIAGNOSIS — M8580 Other specified disorders of bone density and structure, unspecified site: Secondary | ICD-10-CM

## 2015-12-09 DIAGNOSIS — E559 Vitamin D deficiency, unspecified: Secondary | ICD-10-CM

## 2015-12-09 LAB — CBC WITH DIFFERENTIAL/PLATELET
Basophils Absolute: 0 10*3/uL (ref 0.0–0.1)
Basophils Relative: 0.5 % (ref 0.0–3.0)
EOS ABS: 0.1 10*3/uL (ref 0.0–0.7)
EOS PCT: 0.6 % (ref 0.0–5.0)
HCT: 41.6 % (ref 36.0–46.0)
Hemoglobin: 14.4 g/dL (ref 12.0–15.0)
LYMPHS ABS: 3 10*3/uL (ref 0.7–4.0)
Lymphocytes Relative: 36.8 % (ref 12.0–46.0)
MCHC: 34.5 g/dL (ref 30.0–36.0)
MCV: 88.2 fl (ref 78.0–100.0)
MONO ABS: 0.6 10*3/uL (ref 0.1–1.0)
Monocytes Relative: 8.1 % (ref 3.0–12.0)
Neutro Abs: 4.3 10*3/uL (ref 1.4–7.7)
Neutrophils Relative %: 54 % (ref 43.0–77.0)
PLATELETS: 229 10*3/uL (ref 150.0–400.0)
RBC: 4.72 Mil/uL (ref 3.87–5.11)
RDW: 13.1 % (ref 11.5–15.5)
WBC: 8 10*3/uL (ref 4.0–10.5)

## 2015-12-09 LAB — MAGNESIUM: MAGNESIUM: 2.1 mg/dL (ref 1.5–2.5)

## 2015-12-09 LAB — LIPID PANEL
CHOLESTEROL: 208 mg/dL — AB (ref 0–200)
HDL: 46.2 mg/dL (ref >39.00)
LDL Cholesterol: 134 mg/dL — ABNORMAL HIGH (ref 0–99)
NONHDL: 161.97
Total CHOL/HDL Ratio: 5
Triglycerides: 142 mg/dL (ref 0.0–149.0)
VLDL: 28.4 mg/dL (ref 0.0–40.0)

## 2015-12-09 LAB — COMPREHENSIVE METABOLIC PANEL
ALBUMIN: 4.5 g/dL (ref 3.5–5.2)
ALT: 17 U/L (ref 0–35)
AST: 18 U/L (ref 0–37)
Alkaline Phosphatase: 82 U/L (ref 39–117)
BILIRUBIN TOTAL: 0.6 mg/dL (ref 0.2–1.2)
BUN: 13 mg/dL (ref 6–23)
CALCIUM: 9.6 mg/dL (ref 8.4–10.5)
CO2: 31 mEq/L (ref 19–32)
CREATININE: 0.65 mg/dL (ref 0.40–1.20)
Chloride: 103 mEq/L (ref 96–112)
GFR: 98.41 mL/min (ref >60.00)
Glucose, Bld: 98 mg/dL (ref 70–99)
Potassium: 4.3 mEq/L (ref 3.5–5.1)
Sodium: 140 mEq/L (ref 135–145)
TOTAL PROTEIN: 7.1 g/dL (ref 6.0–8.3)

## 2015-12-09 LAB — TSH: TSH: 0.1 u[IU]/mL — ABNORMAL LOW (ref 0.35–4.50)

## 2015-12-09 MED ORDER — VITAMIN D 1000 UNITS PO TABS: 1000.0000 [IU] | ORAL_TABLET | Freq: Every day | ORAL | Status: AC

## 2015-12-09 MED ORDER — CALCIUM 500 MG PO TABS: 500.0000 mg | ORAL_TABLET | Freq: Every day | ORAL | Status: DC

## 2015-12-09 NOTE — Assessment & Plan Note (Signed)
Check vit d level 

## 2015-12-09 NOTE — Assessment & Plan Note (Signed)
Check tsh  Titrate med dose if needed  

## 2015-12-09 NOTE — Progress Notes (Signed)
Subjective:    Patient ID: [name], female    DOB: [birth year], 60 y.o.   MRN: [id number]  HPI She is here to establish with a new pcp.    Hypothyroidism:  She is taking her medication daily and sometimes does not need it.  She denies any recent changes in energy or weight that are unexplained.   GERD:  She is taking her medication daily as prescribed.  She denies any GERD symptoms and feels her GERD is well controlled.   IBS: She takes Librax as needed.    Vitamin d deficiency, osteopenia: She takes vitamin d daily.  She is walking a lot.  She is not up to date with a dexa scan and did not want to schedule that today.    Dizziness:  She has had a few episodes of dizziness - true spinning.  It can last 2-3 hours.  No relation to head movements and no associated to headache.    Lower leg pain:  She has lower leg pain in the evening.  She is unable to tell him if this is cramping.  She does not drink much fluids during the day.    Medications and allergies reviewed with patient and updated if appropriate.  Patient Active Problem List   Diagnosis Date Noted  . Dysphagia, pharyngoesophageal phase 09/22/2014  . Hypothyroidism 03/17/2010  . VITAMIN D DEFICIENCY 03/17/2010  . Osteopenia 03/17/2010  . History of colonic polyps 03/17/2010  . 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 UNSPECIFIED MENOPAUSAL&POSTMENOPAUSAL DISORDER 03/04/2009  . IBS 10/10/2007  . THYROID NODULE, HX OF 03/04/2007    Current Outpatient Prescriptions on File Prior to Visit  Medication Sig Dispense Refill  . clidinium-chlordiazePOXIDE (LIBRAX) 5-2.5 MG per capsule Take 1 capsule by mouth every 6 - 8 hours as needed 60 capsule 0  . levothyroxine (SYNTHROID, LEVOTHROID) 100 MCG tablet Take 1 tablet (100 mcg total) by mouth daily before breakfast. --patient has 5/18 10:45 appt with new pcp--dr Shanique Aslinger 45 tablet 0  . ranitidine (ZANTAC) 150 MG tablet TAKE 1 TABLET (150 MG TOTAL) BY MOUTH 2 (TWO)  TIMES DAILY. 60 tablet 11   No current facility-administered medications on file prior to visit.    Past Medical History  Diagnosis Date  . 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 Helicobacter pylori gastritis 2001  . 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 IBS (irritable bowel syndrome)     Past Surgical History  Procedure Laterality Date  . Thyroidectomy  2002    Hurthle  cell changes on biopsy; TSH goal = < 0.3  . Appendectomy    . Colonoscopy w/ polypectomy  2010    [facility] ( as per her son)but possibly in [place] History  . Marital Status: Married    Spouse Name: N/A  . Number of Children: N/A  . Years of Education: N/A   Social History Main Topics  . Smoking status: Never Smoker   . Smokeless tobacco: Not on file  . Alcohol Use: No  . Drug Use: No  . Sexual Activity:    Partners: Female   Other Topics Concern  . Not on file   Social History Narrative    Family History  Problem Relation Age of Onset  . Heart disease Neg Hx   . Stroke Neg Hx   . Diabetes Neg Hx   .  Cancer Father      ? primary    Review of Systems  Constitutional: Negative for fever, fatigue and unexpected weight change.  HENT: Negative for trouble swallowing.   Respiratory: Negative for cough, shortness of breath and wheezing.   Cardiovascular: Negative for chest pain, palpitations and leg swelling.  Gastrointestinal: Negative for abdominal pain.       Occasional gerd  Musculoskeletal: Negative for back pain.  Neurological: Positive for dizziness. Negative for light-headedness and headaches.       Objective:   Filed Vitals:   12/09/15 1041  BP: 138/88  Pulse: 62  Temp: 98.1 F (36.7 C)  Resp: 16   Filed Weights   12/09/15 1041  Weight: 139 lb (63.05 kg)   Body mass index is 23.85 kg/(m^2).   Physical Exam Constitutional: Appears well-developed and well-nourished. No distress.  Neck: Neck  supple. No tracheal deviation present. No thyromegaly present.  No carotid bruit. No cervical adenopathy.   Cardiovascular: Normal rate, regular rhythm and normal heart sounds.   No murmur heard.  No edema Pulmonary/Chest: Effort normal and breath sounds normal. No respiratory distress. No wheezes.  Abd: soft, nontender, non distended, no HSM       Assessment & Plan:   Discussed health maintenance she should have done  See Problem List for Assessment and Plan of chronic medical problems.   F/u annually Follow up annually

## 2015-12-09 NOTE — Assessment & Plan Note (Signed)
Taking cal and vit d Walks a lot dexa not up to date - advised her to consider having this done - she will discuss with her son

## 2015-12-09 NOTE — Progress Notes (Signed)
Pre visit review using our clinic review tool, if applicable. No additional management support is needed unless otherwise documented below in the visit note. 

## 2015-12-09 NOTE — Patient Instructions (Signed)
You should schedule a mammogram.  You should also consider having a colonoscopy and bone density scan.   Test(s) ordered today. Your results will be released to [place] (or called to you) after review, usually within 72hours after test completion. If any changes need to be made, you will be notified at that same time.  All other Health Maintenance issues reviewed.   All recommended immunizations and age-appropriate screenings are up-to-date or discussed.  No immunizations administered today.   Medications reviewed and updated.  No changes recommended at this time.   Please followup in one year, sooner if needed

## 2015-12-12 ENCOUNTER — Other Ambulatory Visit: Payer: Self-pay | Admitting: Internal Medicine

## 2015-12-12 DIAGNOSIS — E890 Postprocedural hypothyroidism: Secondary | ICD-10-CM

## 2015-12-12 LAB — VITAMIN D 1,25 DIHYDROXY
Vitamin D 1, 25 (OH)2 Total: 52 pg/mL (ref 18–72)
Vitamin D3 1, 25 (OH)2: 52 pg/mL

## 2015-12-13 ENCOUNTER — Telehealth: Payer: Self-pay | Admitting: Emergency Medicine

## 2015-12-13 NOTE — Telephone Encounter (Signed)
Called pt to give lab results, pts cousin answered, informed I would mail results to pt.

## 2016-01-26 ENCOUNTER — Other Ambulatory Visit: Payer: Self-pay | Admitting: Emergency Medicine

## 2016-01-26 MED ORDER — RANITIDINE HCL 150 MG PO TABS: ORAL_TABLET | ORAL | Status: DC

## 2016-03-21 ENCOUNTER — Other Ambulatory Visit: Payer: Self-pay | Admitting: Internal Medicine

## 2016-05-08 ENCOUNTER — Encounter: Payer: Self-pay | Admitting: *Deleted

## 2016-06-21 ENCOUNTER — Other Ambulatory Visit: Payer: Self-pay | Admitting: Internal Medicine

## 2016-06-27 ENCOUNTER — Other Ambulatory Visit: Payer: Self-pay | Admitting: Internal Medicine

## 2016-08-22 ENCOUNTER — Encounter: Payer: Self-pay | Admitting: Internal Medicine

## 2016-08-22 ENCOUNTER — Ambulatory Visit (INDEPENDENT_AMBULATORY_CARE_PROVIDER_SITE_OTHER): Payer: Self-pay | Admitting: Internal Medicine

## 2016-08-22 DIAGNOSIS — B029 Zoster without complications: Secondary | ICD-10-CM

## 2016-08-22 MED ORDER — VALACYCLOVIR HCL 1 G PO TABS: 1000.0000 mg | ORAL_TABLET | Freq: Two times a day (BID) | ORAL | 0 refills | Status: DC

## 2016-08-22 NOTE — Progress Notes (Signed)
Pre visit review using our clinic review tool, if applicable. No additional management support is needed unless otherwise documented below in the visit note. 

## 2016-08-22 NOTE — Patient Instructions (Addendum)
We have sent in medicine for the shingles which is 1 pill in the morning and 1 pill in the evening. It is called valacyclovir or valtrex.   ??? ??? ??? 1 ?? ?? ??? 1 ?? ???? ?? ?? ???? ?????. ??? valacyclovir ?? valtrex?????.

## 2016-08-22 NOTE — Assessment & Plan Note (Signed)
She is not taking medication as prescribed due to language barrier. She is given rx for valtrex today which is BID and she can take this as instructed. She will wait to hear from urgent care if other testing came back positive. Compatible with shingles on exam. Pain treated with advil.

## 2016-08-22 NOTE — Progress Notes (Signed)
   Subjective:    Patient ID: [name], female    DOB: [birth year], 61 y.o.   MRN: [id number]  HPI The patient is a 61 YO female coming in for rash in the vaginal/peri-rectal area. She went to urgent care and was diagnosed with shingles. They gave her acyclovir but did not tell her how to take it. When asked she is taking once daily instead of 5 times per day. She is not having much relief since this weekend. Some pain which is treated with advil and this is helping. She denies fevers or chills. They also took some other tests at the urgent care and they are supposed to hear back tomorrow or the next day.   Review of Systems  Constitutional: Negative.   Respiratory: Negative.   Cardiovascular: Negative.   Genitourinary: Positive for genital sores and vaginal pain. Negative for difficulty urinating, dysuria, flank pain and frequency.  Musculoskeletal: Positive for myalgias.  Skin: Positive for color change and rash.  Neurological: Negative.       Objective:   Physical Exam  Constitutional: She appears well-developed and well-nourished.  HENT:  Head: Normocephalic and atraumatic.  Eyes: EOM are normal.  Neck: Normal range of motion.  Cardiovascular: Normal rate and regular rhythm.   Pulmonary/Chest: Effort normal.  Abdominal: Soft.  Genitourinary:  Genitourinary Comments: Rash compatible with shingles on the left inguinal region not on the labia or in the vaginal canal.   Skin: Skin is warm and dry.   Vitals:   08/22/16 1054  BP: 120/80  Pulse: 69  Resp: 14  Temp: 97.6 F (36.4 C)  TempSrc: Oral  SpO2: 99%  Weight: 144 lb (65.3 kg)  Height: 5\' 4"  (1.626 m)        Assessment & Plan:

## 2016-09-29 ENCOUNTER — Ambulatory Visit: Payer: Self-pay | Admitting: Internal Medicine

## 2016-09-29 NOTE — Progress Notes (Deleted)
Subjective:    Patient ID: [name], female    DOB: 08/16/1955, 61 y.o.   MRN: [id number]  HPI The patient is here for follow up.  Hypothyroidism:  She is taking her medication daily.  She denies any recent changes in energy or weight that are unexplained.     Medications and allergies reviewed with patient and updated if appropriate.  Patient Active Problem List   Diagnosis Date Noted  . Shingles 08/22/2016  . Dysphagia, pharyngoesophageal phase 09/22/2014  . Hypothyroidism 03/17/2010  . Vitamin D deficiency 03/17/2010  . Osteopenia 03/17/2010  . 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 IBS 10/10/2007  . THYROID NODULE, HX OF 03/04/2007    Current Outpatient Prescriptions on File Prior to Visit  Medication Sig Dispense Refill  . acyclovir (ZOVIRAX) 800 MG tablet Take 800 mg by mouth 5 (five) times daily.    . Calcium 500 MG tablet Take 1 tablet (500 mg total) by mouth daily. 90 tablet   . cholecalciferol (VITAMIN D) 1000 units tablet Take 1 tablet (1,000 Units total) by mouth daily.    Marland Kitchen levothyroxine (SYNTHROID, LEVOTHROID) 100 MCG tablet Take 1 tablet (100 mcg total) by mouth daily before breakfast. --- must have labs before further refills. 90 tablet 0  . ranitidine (ZANTAC) 150 MG tablet TAKE 1 TABLET (150 MG TOTAL) BY MOUTH 2 (TWO) TIMES DAILY. 60 tablet 11  . valACYclovir (VALTREX) 1000 MG tablet Take 1 tablet (1,000 mg total) by mouth 2 (two) times daily. 20 tablet 0   No current facility-administered medications on file prior to visit.     Past Medical History:  Diagnosis Date  . Helicobacter pylori gastritis 2001  . IBS (irritable bowel syndrome)   . 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 APPENDECTOMY    . COLONOSCOPY W/ POLYPECTOMY  2010   [facility] ( as per her son)but possibly in [place] 2011  .  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 Marital status: Married    Spouse name: N/A  . Number of children: N/A  . Years of education: N/A   Social History Main Topics  . Smoking status: Never Smoker  . Smokeless tobacco: Never Used  . Alcohol use No  . Drug use: No  . Sexual activity: Yes    Partners: Female   Other Topics Concern  . Not on file   Social History Narrative  . No narrative on file    Family History  Problem Relation Age of Onset  . Heart disease Neg Hx   . Stroke Neg Hx   . Diabetes Neg Hx   . Cancer Father      ? primary    Review of Systems     Objective:  There were no vitals filed for this visit. Wt Readings from Last 3 Encounters:  08/22/16 144 lb (65.3 kg)  12/09/15 139 lb (63 kg)  09/21/14 146 lb (66.2 kg)   There is no height or weight on file to calculate BMI.   Physical Exam    Constitutional: Appears well-developed and well-nourished. No distress.  HENT:  Head: Normocephalic and atraumatic.  Neck: Neck supple. No tracheal deviation present. No thyromegaly present.  No cervical lymphadenopathy Cardiovascular: Normal rate, regular rhythm and normal heart sounds.   No murmur heard. No carotid bruit .  No edema Pulmonary/Chest: Effort normal and breath sounds normal. No respiratory distress. No has no wheezes. No rales.  Skin: Skin is warm and dry. Not diaphoretic.  Psychiatric: Normal mood and affect. Behavior is normal.      Assessment & Plan:    See Problem List for Assessment and Plan of chronic medical problems.

## 2016-10-01 ENCOUNTER — Other Ambulatory Visit: Payer: Self-pay | Admitting: Internal Medicine

## 2016-10-15 NOTE — Progress Notes (Signed)
Subjective:    Patient ID: [name], female    DOB: Jan 06, 1956, 61 y.o.   MRN: [id number]  HPI The patient is here for follow up.  Hypothyroidism:  She is taking her medication daily.  She denies any recent changes in energy or weight that are unexplained.    She travels to [place] yearly.  She gets some of her Health maintenance in [place].    Medications and allergies reviewed with patient and updated if appropriate.  Patient Active Problem List   Diagnosis Date Noted  . Shingles 08/22/2016  . Dysphagia, pharyngoesophageal phase 09/22/2014  . Hypothyroidism 03/17/2010  . Vitamin D deficiency 03/17/2010  . Osteopenia 03/17/2010  . 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 IBS 10/10/2007  . THYROID NODULE, HX OF 03/04/2007    Current Outpatient Prescriptions on File Prior to Visit  Medication Sig Dispense Refill  . acyclovir (ZOVIRAX) 800 MG tablet Take 800 mg by mouth 5 (five) times daily.    . Calcium 500 MG tablet Take 1 tablet (500 mg total) by mouth daily. 90 tablet   . cholecalciferol (VITAMIN D) 1000 units tablet Take 1 tablet (1,000 Units total) by mouth daily.    Marland Kitchen levothyroxine (SYNTHROID, LEVOTHROID) 100 MCG tablet Take 1 tablet (100 mcg total) by mouth daily before breakfast. --- must have labs before further refills. 90 tablet 0  . ranitidine (ZANTAC) 150 MG tablet TAKE 1 TABLET (150 MG TOTAL) BY MOUTH 2 (TWO) TIMES DAILY. 60 tablet 11  . valACYclovir (VALTREX) 1000 MG tablet Take 1 tablet (1,000 mg total) by mouth 2 (two) times daily. 20 tablet 0   No current facility-administered medications on file prior to visit.     Past Medical History:  Diagnosis Date  . Helicobacter pylori gastritis 2001  . IBS (irritable bowel syndrome)   . 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 Pituitary abnormality ([place]) 1990   Enlarged sella; Dr Sherwood Gambler    Past Surgical History:  Procedure Laterality Date  . APPENDECTOMY    . COLONOSCOPY W/  POLYPECTOMY  2010   [facility] ( as per her son)but possibly in [place] 2011  . 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 Marital status: Married    Spouse name: N/A  . Number of children: N/A  . Years of education: N/A   Social History Main Topics  . Smoking status: Never Smoker  . Smokeless tobacco: Never Used  . Alcohol use No  . Drug use: No  . Sexual activity: Yes    Partners: Female   Other Topics Concern  . None   Social History Narrative  . None    Family History  Problem Relation Age of Onset  . Heart disease Neg Hx   . Stroke Neg Hx   . Diabetes Neg Hx   . Cancer Father      ? primary    Review of Systems  Constitutional: Negative for appetite change, chills, fatigue, fever and unexpected weight change.  Respiratory: Negative for cough, shortness of breath and wheezing.   Cardiovascular: Negative for chest pain, palpitations and leg swelling.  Gastrointestinal: Negative for abdominal pain.  Neurological: Negative for light-headedness and headaches.       Objective:   Vitals:   10/16/16 0926  BP: 134/86  Pulse: 66  Resp: 16  Temp:  97.4 F (36.3 C)   Wt Readings from Last 3 Encounters:  10/16/16 141 lb (64 kg)  08/22/16 144 lb (65.3 kg)  12/09/15 139 lb (63 kg)   Body mass index is 24.2 kg/m.   Physical Exam    Constitutional: Appears well-developed and well-nourished. No distress.  HENT:  Head: Normocephalic and atraumatic.  Neck: Neck supple. No tracheal deviation present. No thyromegaly present.  No cervical lymphadenopathy Cardiovascular: Normal rate, regular rhythm and normal heart sounds.   No murmur heard. No carotid bruit .  No edema Pulmonary/Chest: Effort normal and breath sounds normal. No respiratory distress. No has no wheezes. No rales.  Skin: Skin is warm and dry. Not diaphoretic.  Psychiatric: Normal mood and affect. Behavior is normal.       Assessment & Plan:    See Problem List for Assessment and Plan of chronic medical problems.    FU annually

## 2016-10-16 ENCOUNTER — Other Ambulatory Visit (INDEPENDENT_AMBULATORY_CARE_PROVIDER_SITE_OTHER): Payer: Self-pay

## 2016-10-16 ENCOUNTER — Ambulatory Visit (INDEPENDENT_AMBULATORY_CARE_PROVIDER_SITE_OTHER): Payer: Self-pay | Admitting: Internal Medicine

## 2016-10-16 ENCOUNTER — Encounter: Payer: Self-pay | Admitting: Internal Medicine

## 2016-10-16 VITALS — BP 134/86 | HR 66 | Temp 97.4°F | Resp 16 | Wt 141.0 lb

## 2016-10-16 DIAGNOSIS — E890 Postprocedural hypothyroidism: Secondary | ICD-10-CM

## 2016-10-16 LAB — COMPREHENSIVE METABOLIC PANEL
ALT: 17 U/L (ref 0–35)
AST: 16 U/L (ref 0–37)
Albumin: 4.3 g/dL (ref 3.5–5.2)
Alkaline Phosphatase: 71 U/L (ref 39–117)
BUN: 14 mg/dL (ref 6–23)
CALCIUM: 9.3 mg/dL (ref 8.4–10.5)
CHLORIDE: 105 meq/L (ref 96–112)
CO2: 31 meq/L (ref 19–32)
CREATININE: 0.72 mg/dL (ref 0.40–1.20)
GFR: 87.21 mL/min (ref >60.00)
GLUCOSE: 106 mg/dL — AB (ref 70–99)
Potassium: 4.1 mEq/L (ref 3.5–5.1)
Sodium: 141 mEq/L (ref 135–145)
Total Bilirubin: 0.6 mg/dL (ref 0.2–1.2)
Total Protein: 7 g/dL (ref 6.0–8.3)

## 2016-10-16 LAB — TSH: TSH: 0.12 u[IU]/mL — AB (ref 0.35–4.50)

## 2016-10-16 NOTE — Progress Notes (Signed)
Pre visit review using our clinic review tool, if applicable. No additional management support is needed unless otherwise documented below in the visit note. 

## 2016-10-16 NOTE — Assessment & Plan Note (Signed)
Check tsh  Titrate med dose if needed  

## 2016-10-16 NOTE — Patient Instructions (Addendum)
  Test(s) ordered today. Your results will be released to MyChart (or called to you) after review, usually within 72hours after test completion. If any changes need to be made, you will be notified at that same time.  All other Health Maintenance issues reviewed.   All recommended immunizations and age-appropriate screenings are up-to-date or discussed.  No immunizations administered today.   Medications reviewed and updated.  No changes recommended at this time.   Please followup in one year   

## 2016-10-17 ENCOUNTER — Other Ambulatory Visit: Payer: Self-pay | Admitting: Internal Medicine

## 2016-10-17 MED ORDER — LEVOTHYROXINE SODIUM 112 MCG PO TABS: 112.0000 ug | ORAL_TABLET | Freq: Every day | ORAL | 0 refills | Status: DC

## 2016-10-24 ENCOUNTER — Other Ambulatory Visit: Payer: Self-pay | Admitting: Emergency Medicine

## 2016-10-24 ENCOUNTER — Encounter: Payer: Self-pay | Admitting: Emergency Medicine

## 2016-10-24 DIAGNOSIS — E039 Hypothyroidism, unspecified: Secondary | ICD-10-CM

## 2017-01-14 ENCOUNTER — Other Ambulatory Visit: Payer: Self-pay | Admitting: Internal Medicine

## 2017-07-11 ENCOUNTER — Other Ambulatory Visit: Payer: Self-pay | Admitting: Internal Medicine

## 2017-09-14 ENCOUNTER — Other Ambulatory Visit: Payer: Self-pay | Admitting: Internal Medicine

## 2017-10-19 ENCOUNTER — Other Ambulatory Visit: Payer: Self-pay | Admitting: Internal Medicine

## 2017-11-07 ENCOUNTER — Other Ambulatory Visit: Payer: Self-pay | Admitting: Internal Medicine

## 2017-11-27 ENCOUNTER — Other Ambulatory Visit (INDEPENDENT_AMBULATORY_CARE_PROVIDER_SITE_OTHER): Payer: Self-pay

## 2017-11-27 ENCOUNTER — Encounter: Payer: Self-pay | Admitting: Internal Medicine

## 2017-11-27 ENCOUNTER — Ambulatory Visit (INDEPENDENT_AMBULATORY_CARE_PROVIDER_SITE_OTHER): Payer: Self-pay | Admitting: Internal Medicine

## 2017-11-27 VITALS — BP 126/80 | HR 64 | Temp 98.2°F | Resp 16 | Wt 133.0 lb

## 2017-11-27 DIAGNOSIS — M85859 Other specified disorders of bone density and structure, unspecified thigh: Secondary | ICD-10-CM

## 2017-11-27 DIAGNOSIS — R918 Other nonspecific abnormal finding of lung field: Secondary | ICD-10-CM | POA: Insufficient documentation

## 2017-11-27 DIAGNOSIS — I2510 Atherosclerotic heart disease of native coronary artery without angina pectoris: Secondary | ICD-10-CM

## 2017-11-27 DIAGNOSIS — E890 Postprocedural hypothyroidism: Secondary | ICD-10-CM

## 2017-11-27 DIAGNOSIS — E785 Hyperlipidemia, unspecified: Secondary | ICD-10-CM | POA: Insufficient documentation

## 2017-11-27 DIAGNOSIS — E7849 Other hyperlipidemia: Secondary | ICD-10-CM

## 2017-11-27 LAB — COMPREHENSIVE METABOLIC PANEL
ALBUMIN: 4.4 g/dL (ref 3.5–5.2)
ALK PHOS: 81 U/L (ref 39–117)
ALT: 19 U/L (ref 0–35)
AST: 20 U/L (ref 0–37)
BILIRUBIN TOTAL: 0.4 mg/dL (ref 0.2–1.2)
BUN: 22 mg/dL (ref 6–23)
CALCIUM: 9.4 mg/dL (ref 8.4–10.5)
CO2: 30 mEq/L (ref 19–32)
CREATININE: 0.74 mg/dL (ref 0.40–1.20)
Chloride: 101 mEq/L (ref 96–112)
GFR: 84.19 mL/min (ref >60.00)
Glucose, Bld: 95 mg/dL (ref 70–99)
Potassium: 3.9 mEq/L (ref 3.5–5.1)
Sodium: 139 mEq/L (ref 135–145)
TOTAL PROTEIN: 7.2 g/dL (ref 6.0–8.3)

## 2017-11-27 LAB — LIPID PANEL
Cholesterol: 219 mg/dL — ABNORMAL HIGH (ref 0–200)
HDL: 56.7 mg/dL (ref >39.00)
NonHDL: 162
TRIGLYCERIDES: 230 mg/dL — AB (ref 0.0–149.0)
Total CHOL/HDL Ratio: 4
VLDL: 46 mg/dL — ABNORMAL HIGH (ref 0.0–40.0)

## 2017-11-27 LAB — TSH: TSH: 0.01 u[IU]/mL — AB (ref 0.35–4.50)

## 2017-11-27 LAB — LDL CHOLESTEROL, DIRECT: LDL DIRECT: 122 mg/dL

## 2017-11-27 MED ORDER — LEVOTHYROXINE SODIUM 112 MCG PO TABS: 112.0000 ug | ORAL_TABLET | Freq: Every day | ORAL | 3 refills | Status: DC

## 2017-11-27 MED ORDER — ATORVASTATIN CALCIUM 20 MG PO TABS: 20.0000 mg | ORAL_TABLET | Freq: Every day | ORAL | 3 refills | Status: DC

## 2017-11-27 NOTE — Assessment & Plan Note (Signed)
Coronary calcium test done in [place] 05/09/2017: Calcium score 14.53, calcium plaque in LAD and pRCA of 30% Will start atorvastatin Recheck CMP, lipid panel in 2 months

## 2017-11-27 NOTE — Patient Instructions (Signed)
Have blood work done in 2 months - come to the lab in the basement fasting.  The lab opens up at 7:30 am Monday - Friday.  You do not need an appointment -  you can just come.      Start a cholesterol lowering medication, lipitor.  Continue your thyroid medication.    A referral was ordered for pulmonary (lung doctor) - they will call you to set up an appointment.  They will review your lung Ct scan.

## 2017-11-27 NOTE — Assessment & Plan Note (Signed)
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

## 2017-11-27 NOTE — Assessment & Plan Note (Signed)
Had a CT of the chest in [place] 04/2017 that showed pulmonary nodules-she does have the disc with the CT scan on it Follow-up CT scan recommended Since she does not have insurance I will refer to pulmonary to see if a follow-up is necessary

## 2017-11-27 NOTE — Assessment & Plan Note (Signed)
Level of pancreas showed LDL of 143, coronary calcium test shows plaque of 30% in 2 arteries We will start statin-atorvastatin 20 mg daily Check CMP, lipid panel in 2 months Continue regular exercise

## 2017-11-27 NOTE — Assessment & Plan Note (Signed)
DEXA scan done in [place] 04/2017-showed osteopenia She is exercising regularly Continue calcium and vitamin D

## 2017-11-27 NOTE — Progress Notes (Signed)
Subjective:    Patient ID: [name], female    DOB: 06/05/1956, 62 y.o.   MRN: [id number]  HPI The patient is here for follow up.  Hypothyroidism:  She is taking her medication daily.  She denies any recent changes in energy or weight that are unexplained.   Hyperglycemia: She is compliant with a low sugar/carbohydrate diet.  She is exercising regularly.  She had an A1c done last October in [place] and it was 5.6.  Hyperlipidemia, CAD: She had her cholesterol checked in [place] in October 2018 and her LDL was 143.  When she was there she had a day long evaluation that included every possible test from head to toe.  Part of the evaluation was a coronary CT scan and her CT calcium score was 14.53.  She did have some calcium plaque in her LAD and pRCA 30% stenosis.  She did have couple episodes of chest pain few months ago, but has not had any since then.  Pain was transient.  She is exercising regularly and denies any chest pain with exercise.  While she was in [place] she had a mammogram, Pap smear, colonoscopy, chest x-ray, CT of the chest, coronary calcium test, DEXA and complete blood work.  Pulmonary nodules: The CT of the chest report from [place] mentions pulmonary nodules.  She does have the disc with this CAT scan.  They advised follow-up.  She does not have insurance in this country and will not be returning to [place] anytime soon.  She denies any cough, wheeze or shortness of breath.  Medications and allergies reviewed with patient and updated if appropriate.  Patient Active Problem List   Diagnosis Date Noted  . Shingles 08/22/2016  . Dysphagia, pharyngoesophageal phase 09/22/2014  . Hypothyroidism 03/17/2010  . Vitamin D deficiency 03/17/2010  . Osteopenia 03/17/2010  . 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 IBS 10/10/2007  . THYROID NODULE, HX OF 03/04/2007    Current Outpatient Medications on File Prior to Visit  Medication Sig Dispense Refill  . acyclovir  (ZOVIRAX) 800 MG tablet Take 800 mg by mouth 5 (five) times daily.    . Calcium 500 MG tablet Take 1 tablet (500 mg total) by mouth daily. 90 tablet   . cholecalciferol (VITAMIN D) 1000 units tablet Take 1 tablet (1,000 Units total) by mouth daily.    Marland Kitchen levothyroxine (SYNTHROID, LEVOTHROID) 112 MCG tablet TAKE 1 TABLET (112 MCG TOTAL) BY MOUTH DAILY. --- OFFICE VISIT NEEDED FOR FURTHER REFILLS 15 tablet 0  . valACYclovir (VALTREX) 1000 MG tablet Take 1 tablet (1,000 mg total) by mouth 2 (two) times daily. 20 tablet 0   No current facility-administered medications on file prior to visit.     Past Medical History:  Diagnosis Date  . Helicobacter pylori gastritis 2001  . IBS (irritable bowel syndrome)   . 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 Pituitary abnormality ([facility]) 1990   Enlarged sella; Dr Sherwood Gambler    Past Surgical History:  Procedure Laterality Date  . APPENDECTOMY    . COLONOSCOPY W/ POLYPECTOMY  2010   [facility] ( as per her son)but possibly in [place] 2011  . 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 Marital status: Married    Spouse name: Not on file  . Number of children: Not on file  .  Years of education: Not on file  . Highest education level: Not on file  Occupational History  . Not on file  Social Needs  . Financial resource strain: Not on file  . Food insecurity:    Worry: Not on file    Inability: Not on file  . Transportation needs:    Medical: Not on file    Non-medical: Not on file  Tobacco Use  . Smoking status: Never Smoker  . Smokeless tobacco: Never Used  Substance and Sexual Activity  . Alcohol use: No  . Drug use: No  . Sexual activity: Yes    Partners: Female  Lifestyle  . Physical activity:    Days per week: Not on file    Minutes per session: Not on file  . Stress: Not on file  Relationships  . Social connections:    Talks on phone: Not  on file    Gets together: Not on file    Attends religious service: Not on file    Active member of club or organization: Not on file    Attends meetings of clubs or organizations: Not on file    Relationship status: Not on file  Other Topics Concern  . Not on file  Social History Narrative  . Not on file    Family History  Problem Relation Age of Onset  . Heart disease Neg Hx   . Stroke Neg Hx   . Diabetes Neg Hx   . Cancer Father         ? primary    Review of Systems  Constitutional: Negative for chills and fever.  Respiratory: Negative for cough, shortness of breath and wheezing.   Cardiovascular: Positive for chest pain (2-3 months ago had chest 2-3 times - none since, lasted ). Negative for palpitations and leg swelling.  Gastrointestinal: Negative for abdominal pain.       No gerd  Neurological: Negative for light-headedness and headaches.       Objective:   Vitals:   11/27/17 1540  BP: 126/80  Pulse: 64  Resp: 16  Temp: 98.2 F (36.8 C)  SpO2: 98%   BP Readings from Last 3 Encounters:  11/27/17 126/80  10/16/16 134/86  08/22/16 120/80   Wt Readings from Last 3 Encounters:  11/27/17 133 lb (60.3 kg)  10/16/16 141 lb (64 kg)  08/22/16 144 lb (65.3 kg)   Body mass index is 22.83 kg/m.   Physical Exam    Constitutional: Appears well-developed and well-nourished. No distress.  HENT:  Head: Normocephalic and atraumatic.  Neck: Neck supple. No tracheal deviation present. No thyromegaly present.  No cervical lymphadenopathy Cardiovascular: Normal rate, regular rhythm and normal heart sounds.   No murmur heard. No carotid bruit .  No edema Pulmonary/Chest: Effort normal and breath sounds normal. No respiratory distress. No has no wheezes. No rales. Abdomen: Soft, nontender, nondistended Skin: Skin is warm and dry. Not diaphoretic.  Psychiatric: Normal mood and affect. Behavior is normal.      Assessment & Plan:    See Problem List for  Assessment and Plan of chronic medical problems.  Follow-up in 6 months

## 2017-11-30 ENCOUNTER — Encounter: Payer: Self-pay | Admitting: Emergency Medicine

## 2017-11-30 ENCOUNTER — Other Ambulatory Visit: Payer: Self-pay | Admitting: Emergency Medicine

## 2017-11-30 MED ORDER — LEVOTHYROXINE SODIUM 100 MCG PO TABS: 100.0000 ug | ORAL_TABLET | Freq: Every day | ORAL | 1 refills | Status: DC

## 2018-01-15 ENCOUNTER — Ambulatory Visit (INDEPENDENT_AMBULATORY_CARE_PROVIDER_SITE_OTHER): Payer: Self-pay | Admitting: Internal Medicine

## 2018-01-15 ENCOUNTER — Encounter: Payer: Self-pay | Admitting: Internal Medicine

## 2018-01-15 VITALS — BP 120/80 | HR 74 | Ht 63.0 in | Wt 129.0 lb

## 2018-01-15 DIAGNOSIS — R911 Solitary pulmonary nodule: Secondary | ICD-10-CM

## 2018-01-15 DIAGNOSIS — R918 Other nonspecific abnormal finding of lung field: Secondary | ICD-10-CM

## 2018-01-15 NOTE — Progress Notes (Signed)
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 Pulmonary Consult    Referred by Dr. Quay Burow for eval of abnormal cxr.  The cxr was done as part of her annual physical while she was in [place] in 2018.  Pt denies any respiratory co's.   Not limited by breathing from desired activities    No obvious day to day or daytime variability or assoc excess/ purulent sputum or mucus plugs or hemoptysis or cp or chest tightness, subjective wheeze or overt sinus or hb symptoms.   Sleeps fine  without nocturnal  or early am exacerbation  of respiratory  c/o's or need for noct saba. Also denies any obvious fluctuation of symptoms with weather or environmental changes or other aggravating or alleviating factors except as outlined above   No unusual exposure hx or h/o childhood pna/ asthma or knowledge of premature birth.  Current Allergies, Complete Past Medical History, Past Surgical History, Family History, and Social History were reviewed in [employer] record.  ROS  The following are not active complaints unless bolded Hoarseness, sore throat, dysphagia, dental problems, itching, sneezing,  nasal congestion or discharge of excess mucus or purulent secretions, ear ache,   fever, chills, sweats, unintended wt loss or wt gain, classically pleuritic or exertional cp,  orthopnea pnd or arm/hand swelling  or leg swelling, presyncope, palpitations, abdominal pain, anorexia,  nausea, vomiting, diarrhea  or change in bowel habits or change in bladder habits, change in stools or change in urine, dysuria, hematuria,  rash, arthralgias, visual complaints, headache, numbness, weakness or ataxia or problems with walking or coordination,  change in mood or  memory.        Current Meds  Medication Sig  . atorvastatin (LIPITOR) 20 MG tablet Take 1 tablet (20 mg total) by mouth daily.  . Calcium 500 MG tablet Take 1 tablet (500 mg total) by mouth daily.  . cholecalciferol (VITAMIN D) 1000 units tablet Take 1 tablet (1,000 Units total) by mouth daily.  Marland Kitchen levothyroxine (SYNTHROID, LEVOTHROID) 100 MCG tablet Take 1 tablet (100 mcg total) by mouth daily.        Review of Systems     Objective:   Physical Exam    healthy appearing amb korean female nad   Wt Readings from Last 3 Encounters:  01/15/18 129 lb (58.5 kg)  11/27/17 133 lb (60.3 kg)  10/16/16 141 lb (64 kg)     Vital signs reviewed - Note on arrival 02 sats  100% on RA     HEENT: nl dentition, turbinates bilaterally, and oropharynx. Nl external ear canals without cough reflex   NECK :  without JVD/Nodes/TM/ nl carotid upstrokes bilaterally   LUNGS: no acc muscle use,  Nl contour chest which is clear to A and P bilaterally without cough on insp or exp maneuvers  CV:  RRR  no s3 or murmur or increase in P2, and no edema   ABD:  soft and nontender with nl inspiratory excursion in the supine position. No bruits or organomegaly appreciated, bowel sounds nl  MS:  Nl gait/ ext warm without deformities, calf tenderness, cyanosis or clubbing No obvious joint restrictions   SKIN: warm and dry without lesions    NEURO:  alert, approp, nl sensorium with  no motor or cerebellar deficits apparent.        Assessment:

## 2018-01-15 NOTE — Patient Instructions (Signed)
Please see patient coordinator before you leave today  to schedule CT chest without contrast

## 2018-01-15 NOTE — Assessment & Plan Note (Addendum)
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

## 2018-01-31 ENCOUNTER — Ambulatory Visit (INDEPENDENT_AMBULATORY_CARE_PROVIDER_SITE_OTHER)
Admission: RE | Admit: 2018-01-31 | Discharge: 2018-01-31 | Disposition: A | Discharge status: Home / Self Care | Payer: Self-pay | Source: Ambulatory Visit | Attending: Internal Medicine | Admitting: Internal Medicine

## 2018-01-31 DIAGNOSIS — R911 Solitary pulmonary nodule: Secondary | ICD-10-CM

## 2018-01-31 DIAGNOSIS — R918 Other nonspecific abnormal finding of lung field: Secondary | ICD-10-CM

## 2018-01-31 IMAGING — CT CT CHEST W/O CM
2 of 3 series · 15 of 36 positions shown, 18 images · non-contrast
Comparison: None available at the time of this dictation.

CLINICAL DATA: Reported history of pulmonary nodules on prior
outside chest imaging.

EXAM:
CT CHEST WITHOUT CONTRAST
TECHNIQUE: Multidetector CT imaging of the chest was performed following the
standard protocol without IV contrast.

[Series 2: thorax · axial · 0.68mm/px · z∈[-295,-51]mm · 12 of 144 slices shown, 15 images]
[im 11/144  mediastinal]
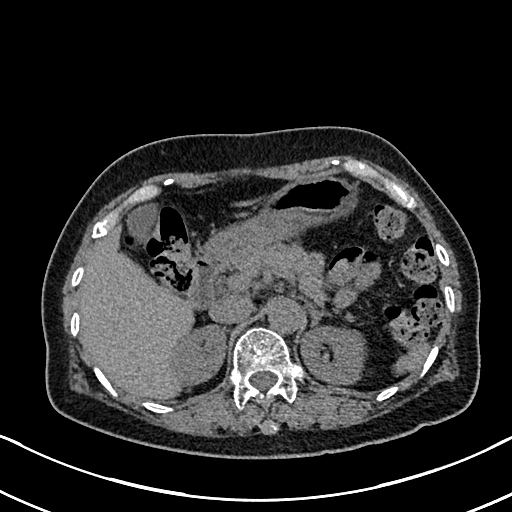
[im 11/144  lung]
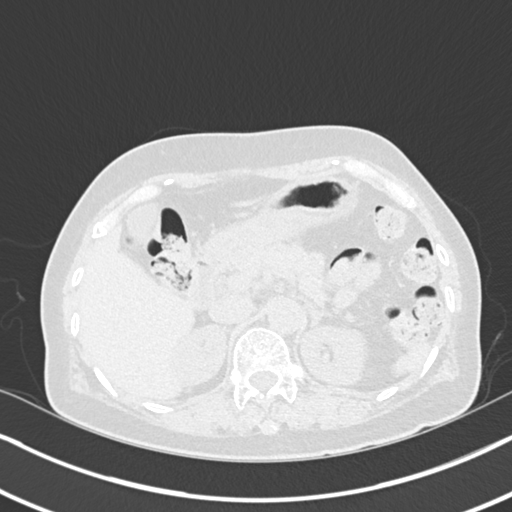
[im 22/144  lung]
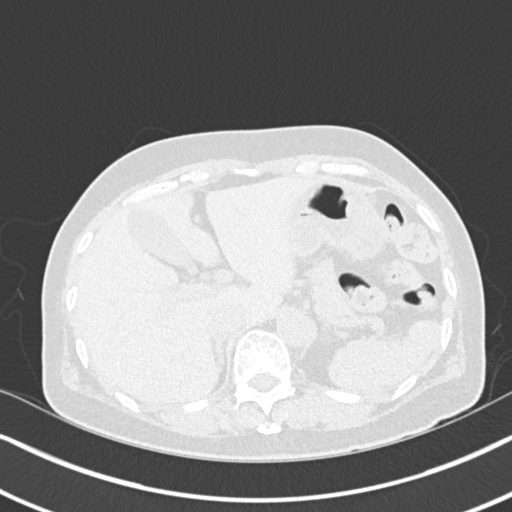
[im 32/144  lung]
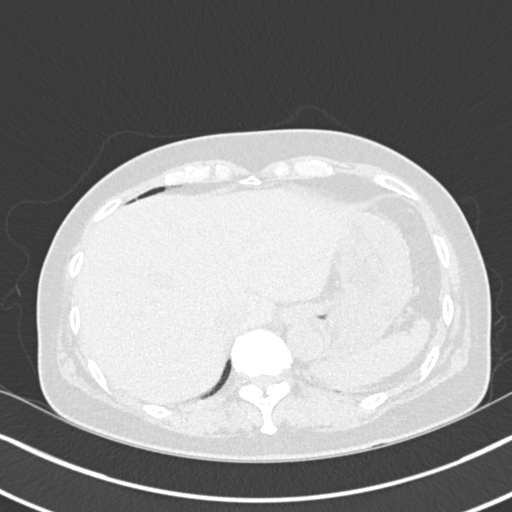
[im 43/144  lung]
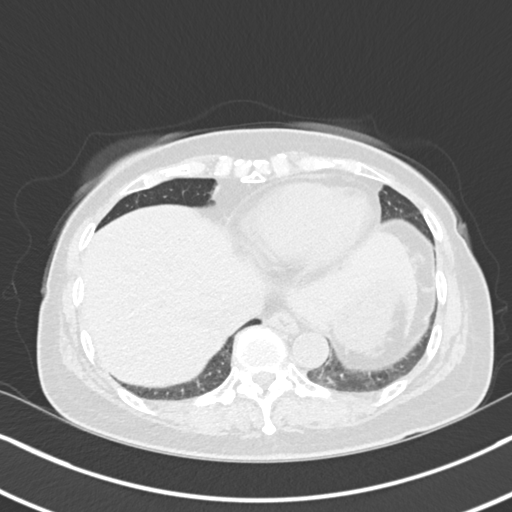
[im 53/144  mediastinal]
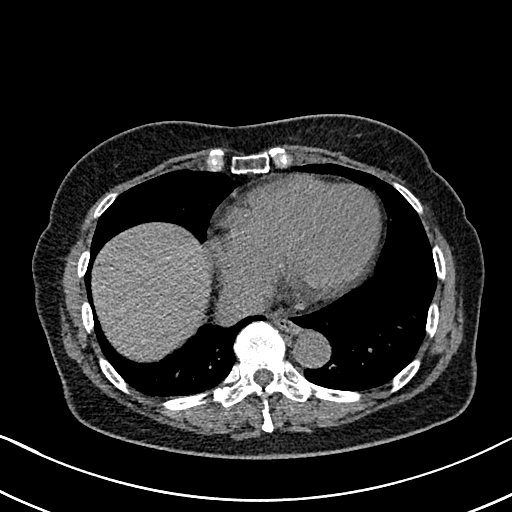
[im 53/144  lung]
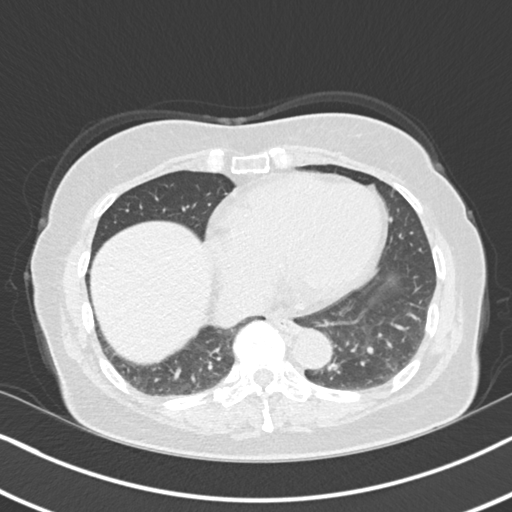
[im 64/144  lung]
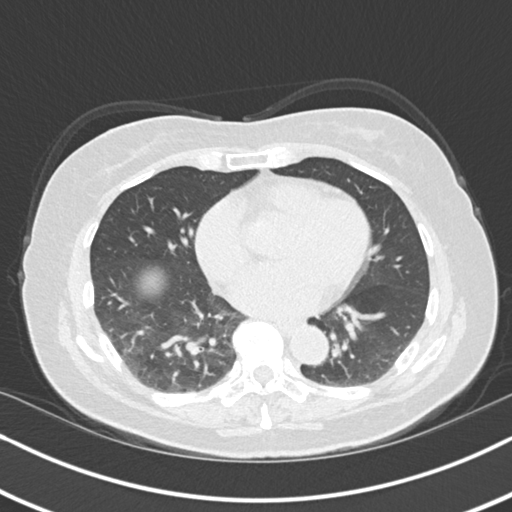
[im 80/144  lung]
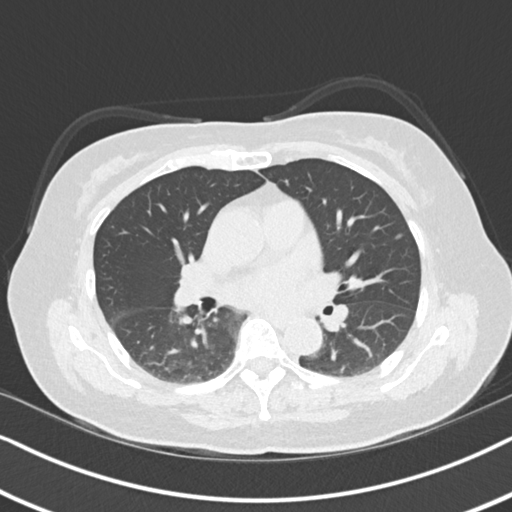
[im 91/144  lung]
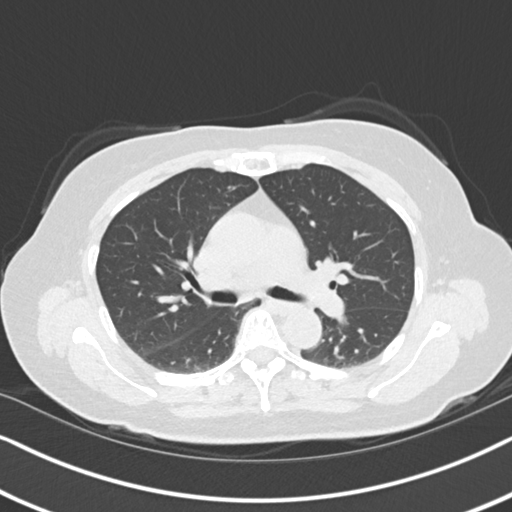
[im 101/144  mediastinal]
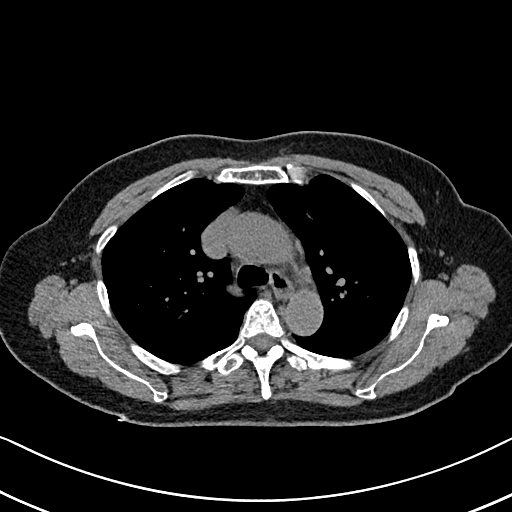
[im 101/144  lung]
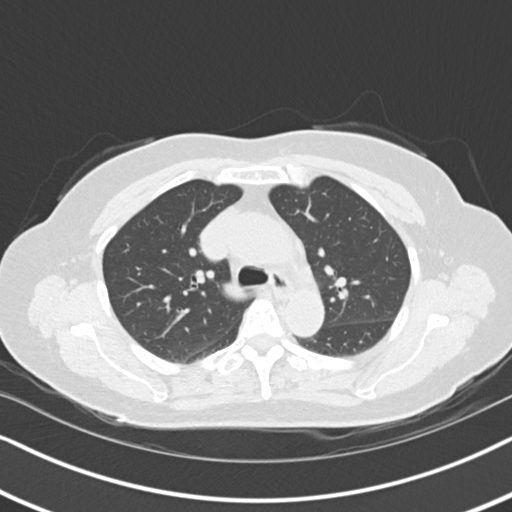
[im 112/144  lung]
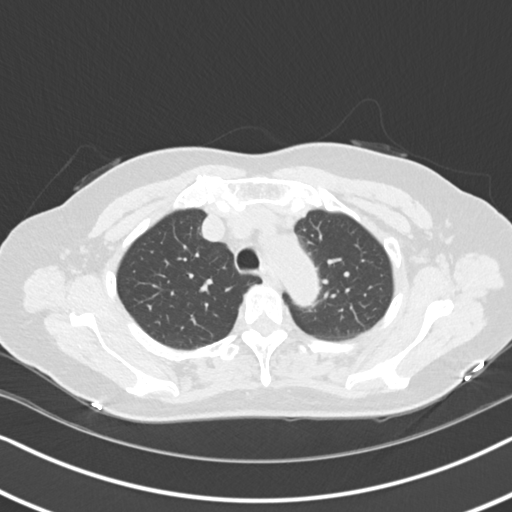
[im 122/144  lung]
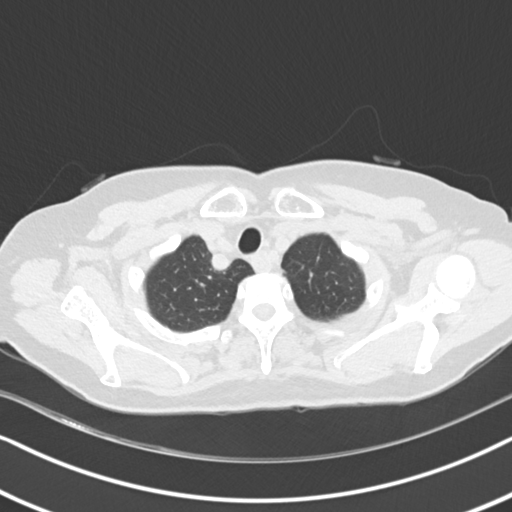
[im 133/144  lung]
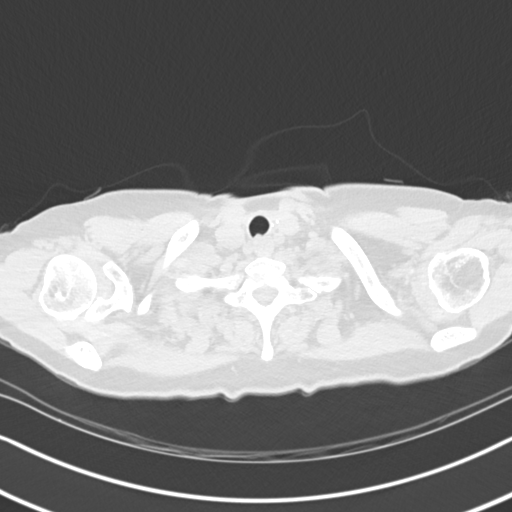

[Series 5: coronal · coronal · 0.59mm/px · 3 of 108 slices shown]
[im 22/108  lung]
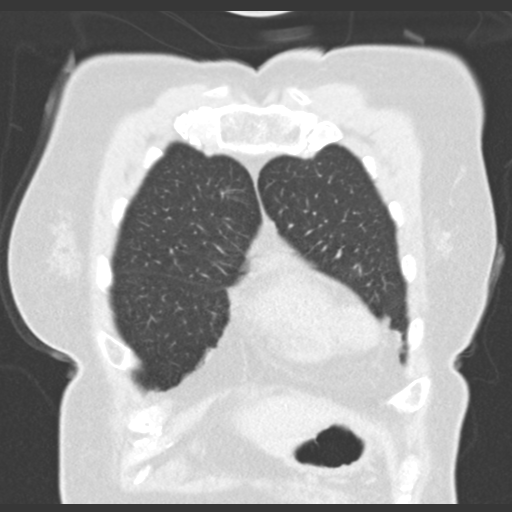
[im 43/108  lung]
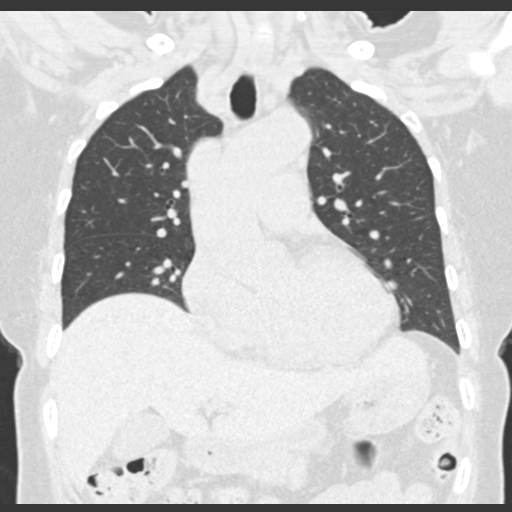
[im 65/108  lung]
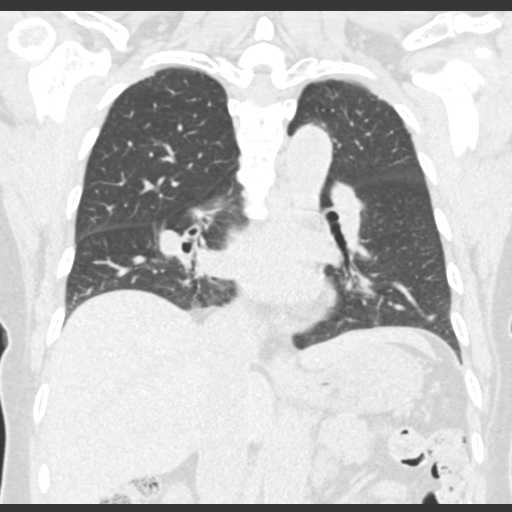

[15 of 36 positions shown; findings below may reference images not displayed]

FINDINGS: Cardiovascular: Normal heart size. No significant pericardial
effusion/thickening. Left anterior descending coronary
atherosclerosis. Mildly atherosclerotic nonaneurysmal thoracic
aorta. Normal caliber pulmonary arteries.

Mediastinum/Nodes: Total thyroidectomy. Unremarkable esophagus. No
pathologically enlarged axillary, mediastinal or hilar lymph nodes,
noting limited sensitivity for the detection of hilar adenopathy on
this noncontrast study.

Lungs/Pleura: No pneumothorax. No pleural effusion. No acute
consolidative airspace disease or lung masses. Apical right upper
lobe solid 5 mm pulmonary nodule (series 3/image 17). Plaque-like 5
mm left upper lobe nodule (series 3/image 65). No additional
significant pulmonary nodules.

Upper abdomen: No acute abnormality.

Musculoskeletal: No aggressive appearing focal osseous lesions. Mild
thoracic spondylosis.
IMPRESSION: 1. Two solid pulmonary nodules, largest 5 mm. No follow-up needed if
patient is low-risk (and has no known or suspected primary
neoplasm). Non-contrast chest CT can be considered in 12 months if
patient is high-risk. This recommendation follows the consensus
statement: Guidelines for Management of Incidental Pulmonary Nodules
Detected on CT Images:From the [HOSPITAL] [9U]; published
online before print (10.1148/radiol.[PHONE_NUMBER]).
2. One vessel coronary atherosclerosis.

Aortic Atherosclerosis ([9U]-[9U]).

## 2018-02-01 NOTE — Progress Notes (Signed)
ATC, NA and no option to leave msg 

## 2018-02-04 NOTE — Progress Notes (Signed)
Spoke with the pt's son, Leta Jungling and notified of results/recs and he verbalized understanding and will inform the pt

## 2018-06-03 ENCOUNTER — Ambulatory Visit: Payer: Self-pay | Admitting: Internal Medicine

## 2018-06-03 NOTE — Progress Notes (Signed)
Subjective:    Patient ID: [name], female    DOB: [birth year], 62 y.o.   MRN: [id number]  HPI The patient is here for follow up.   Hypothyroidism:  She is taking her medication daily.  She denies any recent changes in energy or weight that are unexplained.   Hyperglycemia:  She is compliant with a low sugar/carbohydrate diet.  She is exercising regularly.  CAD, Hyperlipidemia: She is taking her medication daily. She is compliant with a low fat/cholesterol and low sodium diet. She is exercising regularly. She denies myalgias, chest pain, palpitations, SOB.   Osteopenia:  Last dexa was 2018.  She is exercising.  She is calcium and vitamin d daily.    Medications and allergies reviewed with patient and updated if appropriate.  Patient Active Problem List   Diagnosis Date Noted  . Pulmonary nodules 11/27/2017  . Hyperlipidemia 11/27/2017  . CAD (coronary artery disease) 11/27/2017  . Shingles 08/22/2016  . Hypothyroidism 03/17/2010  . Vitamin D deficiency 03/17/2010  . Osteopenia 03/17/2010  . 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 IBS 10/10/2007  . THYROID NODULE, HX OF 03/04/2007    Current Outpatient Medications on File Prior to Visit  Medication Sig Dispense Refill  . atorvastatin (LIPITOR) 20 MG tablet Take 1 tablet (20 mg total) by mouth daily. 90 tablet 3  . Calcium 500 MG tablet Take 1 tablet (500 mg total) by mouth daily. 90 tablet   . cholecalciferol (VITAMIN D) 1000 units tablet Take 1 tablet (1,000 Units total) by mouth daily.    Marland Kitchen levothyroxine (SYNTHROID, LEVOTHROID) 100 MCG tablet Take 1 tablet (100 mcg total) by mouth daily. 90 tablet 1   No current facility-administered medications on file prior to visit.     Past Medical History:  Diagnosis Date  . Helicobacter pylori gastritis 2001  . IBS (irritable bowel syndrome)   . 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 Pituitary abnormality ([place]) 1990   Enlarged sella; Dr  Sherwood Gambler    Past Surgical History:  Procedure Laterality Date  . APPENDECTOMY    . COLONOSCOPY W/ POLYPECTOMY  2010   [facility] ( as per her son)but possibly in [place] 2011  . 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 Marital status: Married    Spouse name: Not on file  . Number of children: Not on file  . Years of education: Not on file  . Highest education level: Not on file  Occupational History  . Not on file  Social Needs  . Financial resource strain: Not on file  . Food insecurity:    Worry: Not on file    Inability: Not on file  . Transportation needs:    Medical: Not on file    Non-medical: Not on file  Tobacco Use  . Smoking status: Never Smoker  . Smokeless tobacco: Never Used  Substance and Sexual Activity  . Alcohol use: No  . Drug use: No  . Sexual activity: Yes    Partners: Female  Lifestyle  . Physical activity:    Days per week: Not on file    Minutes per session: Not on file  . Stress: Not on file  Relationships  . Social connections:    Talks on phone: Not on file    Gets together: Not on file  Attends religious service: Not on file    Active member of club or organization: Not on file    Attends meetings of clubs or organizations: Not on file    Relationship status: Not on file  Other Topics Concern  . Not on file  Social History Narrative  . Not on file    Family History  Problem Relation Age of Onset  . Heart disease Neg Hx   . Stroke Neg Hx   . Diabetes Neg Hx   . Cancer Father         ? primary    Review of Systems  Constitutional: Negative for chills, fatigue and fever.  Respiratory: Negative for cough, shortness of breath and wheezing.   Cardiovascular: Positive for leg swelling (mild R ankle at times). Negative for chest pain and palpitations.  Neurological: Negative for dizziness, light-headedness and headaches.       Objective:   Vitals:     06/04/18 1321  BP: (!) 142/80  Pulse: 73  Resp: 16  Temp: 97.9 F (36.6 C)  SpO2: 98%   BP Readings from Last 3 Encounters:  06/04/18 (!) 142/80  01/15/18 120/80  11/27/17 126/80   Wt Readings from Last 3 Encounters:  06/04/18 136 lb (61.7 kg)  01/15/18 129 lb (58.5 kg)  11/27/17 133 lb (60.3 kg)   Body mass index is 24.09 kg/m.   Physical Exam    Constitutional: Appears well-developed and well-nourished. No distress.  HENT:  Head: Normocephalic and atraumatic.  Neck: Neck supple. No tracheal deviation present. No thyromegaly present.  No cervical lymphadenopathy Cardiovascular: Normal rate, regular rhythm and normal heart sounds.   No murmur heard. No carotid bruit .  No edema Pulmonary/Chest: Effort normal and breath sounds normal. No respiratory distress. No has no wheezes. No rales.  Skin: Skin is warm and dry. Not diaphoretic.  Psychiatric: Normal mood and affect. Behavior is normal.      Assessment & Plan:    See Problem List for Assessment and Plan of chronic medical problems.

## 2018-06-04 ENCOUNTER — Ambulatory Visit (INDEPENDENT_AMBULATORY_CARE_PROVIDER_SITE_OTHER): Payer: Self-pay | Admitting: Internal Medicine

## 2018-06-04 ENCOUNTER — Encounter: Payer: Self-pay | Admitting: Internal Medicine

## 2018-06-04 ENCOUNTER — Other Ambulatory Visit (INDEPENDENT_AMBULATORY_CARE_PROVIDER_SITE_OTHER): Payer: Self-pay

## 2018-06-04 VITALS — BP 142/80 | HR 73 | Temp 97.9°F | Resp 16 | Ht 63.0 in | Wt 136.0 lb

## 2018-06-04 DIAGNOSIS — E7849 Other hyperlipidemia: Secondary | ICD-10-CM

## 2018-06-04 DIAGNOSIS — R739 Hyperglycemia, unspecified: Secondary | ICD-10-CM

## 2018-06-04 DIAGNOSIS — M85851 Other specified disorders of bone density and structure, right thigh: Secondary | ICD-10-CM

## 2018-06-04 DIAGNOSIS — E890 Postprocedural hypothyroidism: Secondary | ICD-10-CM

## 2018-06-04 DIAGNOSIS — M85852 Other specified disorders of bone density and structure, left thigh: Secondary | ICD-10-CM

## 2018-06-04 DIAGNOSIS — I2510 Atherosclerotic heart disease of native coronary artery without angina pectoris: Secondary | ICD-10-CM

## 2018-06-04 LAB — COMPREHENSIVE METABOLIC PANEL
ALBUMIN: 4.5 g/dL (ref 3.5–5.2)
ALK PHOS: 98 U/L (ref 39–117)
ALT: 21 U/L (ref 0–35)
AST: 18 U/L (ref 0–37)
BUN: 20 mg/dL (ref 6–23)
CO2: 29 mEq/L (ref 19–32)
CREATININE: 0.68 mg/dL (ref 0.40–1.20)
Calcium: 9.3 mg/dL (ref 8.4–10.5)
Chloride: 102 mEq/L (ref 96–112)
GFR: 92.67 mL/min (ref >60.00)
Glucose, Bld: 102 mg/dL — ABNORMAL HIGH (ref 70–99)
POTASSIUM: 4 meq/L (ref 3.5–5.1)
SODIUM: 137 meq/L (ref 135–145)
TOTAL PROTEIN: 7.2 g/dL (ref 6.0–8.3)
Total Bilirubin: 0.4 mg/dL (ref 0.2–1.2)

## 2018-06-04 LAB — LDL CHOLESTEROL, DIRECT: LDL DIRECT: 57 mg/dL

## 2018-06-04 LAB — CBC WITH DIFFERENTIAL/PLATELET
BASOS ABS: 0 10*3/uL (ref 0.0–0.1)
Basophils Relative: 0.5 % (ref 0.0–3.0)
EOS ABS: 0 10*3/uL (ref 0.0–0.7)
Eosinophils Relative: 0.5 % (ref 0.0–5.0)
HCT: 43 % (ref 36.0–46.0)
HEMOGLOBIN: 14.3 g/dL (ref 12.0–15.0)
Lymphocytes Relative: 26.7 % (ref 12.0–46.0)
Lymphs Abs: 2.5 10*3/uL (ref 0.7–4.0)
MCHC: 33.4 g/dL (ref 30.0–36.0)
MCV: 91.5 fl (ref 78.0–100.0)
MONO ABS: 0.9 10*3/uL (ref 0.1–1.0)
Monocytes Relative: 9.8 % (ref 3.0–12.0)
Neutro Abs: 5.8 10*3/uL (ref 1.4–7.7)
Neutrophils Relative %: 62.5 % (ref 43.0–77.0)
Platelets: 208 10*3/uL (ref 150.0–400.0)
RBC: 4.7 Mil/uL (ref 3.87–5.11)
RDW: 13.5 % (ref 11.5–15.5)
WBC: 9.3 10*3/uL (ref 4.0–10.5)

## 2018-06-04 LAB — LIPID PANEL
Cholesterol: 139 mg/dL (ref 0–200)
HDL: 55.6 mg/dL (ref >39.00)
NonHDL: 83.84
Total CHOL/HDL Ratio: 3
Triglycerides: 278 mg/dL — ABNORMAL HIGH (ref 0.0–149.0)
VLDL: 55.6 mg/dL — ABNORMAL HIGH (ref 0.0–40.0)

## 2018-06-04 LAB — URIC ACID: Uric Acid, Serum: 4.3 mg/dL (ref 2.4–7.0)

## 2018-06-04 LAB — TSH: TSH: 0.01 u[IU]/mL — ABNORMAL LOW (ref 0.35–4.50)

## 2018-06-04 LAB — HEMOGLOBIN A1C: Hgb A1c MFr Bld: 5.7 % (ref 4.6–6.5)

## 2018-06-04 NOTE — Assessment & Plan Note (Signed)
Check lipid panel  Continue daily statin Regular exercise and healthy diet encouraged  

## 2018-06-04 NOTE — Assessment & Plan Note (Signed)
No chest pain, palps, sob Taking lipitor daily Exercising Check cmp, cbc, tsh

## 2018-06-04 NOTE — Patient Instructions (Signed)
  Tests ordered today. Your results will be released to [facility] (or called to you) after review, usually within 72hours after test completion. If any changes need to be made, you will be notified at that same time.   Medications reviewed and updated.  Changes include :   none  Your prescription(s) have been submitted to your pharmacy. Please take as directed and contact our office if you believe you are having problem(s) with the medication(s).   Please followup in one year

## 2018-06-04 NOTE — Assessment & Plan Note (Signed)
a1c

## 2018-06-04 NOTE — Assessment & Plan Note (Signed)
dexa up to date Exercising Taking vitamin d and calcium daily

## 2018-06-04 NOTE — Assessment & Plan Note (Signed)
Clinically euthyroid Check tsh  Titrate med dose if needed  

## 2018-06-05 ENCOUNTER — Other Ambulatory Visit: Payer: Self-pay | Admitting: Internal Medicine

## 2018-06-05 NOTE — Telephone Encounter (Signed)
Does this need to be changed after labs yesterday?

## 2018-11-30 ENCOUNTER — Other Ambulatory Visit: Payer: Self-pay | Admitting: Internal Medicine

## 2018-12-15 ENCOUNTER — Other Ambulatory Visit: Payer: Self-pay | Admitting: Internal Medicine

## 2019-06-09 ENCOUNTER — Other Ambulatory Visit: Payer: Self-pay | Admitting: Internal Medicine

## 2019-07-08 ENCOUNTER — Other Ambulatory Visit: Payer: Self-pay | Admitting: Internal Medicine

## 2019-07-10 NOTE — Progress Notes (Signed)
Subjective:    Patient ID: [name], female    DOB: Mar 25, 1956, 63 y.o.   MRN: [id number]  HPI The patient is here for follow up.  She is exercising regularly.    CAD, Hyperlipidemia: She is not taking her statin daily - she has not taken it in a while. She is compliant with a low fat/cholesterol diet.  She has had some chest pain - it started last month.  Does not occur regularly and has no associated symptoms.  It does not last long.  She is not able to tell me much more about the pain.  She has not had it this week.  It is not increasing in frequency.    Hyperglycemia:  She is not always compliant with a low sugar/carbohydrate diet.  She is exercising regularly.  Hypothyroidism, h/o hurthle cell changes:  She is taking her medication daily.  Her weight has been despite exercising a good amount.  There has been no change in her diet.  Her energy level is good some days and she feels tired some days.      Medications and allergies reviewed with patient and updated if appropriate.  Patient Active Problem List   Diagnosis Date Noted  . Hyperglycemia 06/04/2018  . Pulmonary nodules 11/27/2017  . Hyperlipidemia 11/27/2017  . CAD (coronary artery disease) 11/27/2017  . Shingles 08/22/2016  . Hypothyroidism 03/17/2010  . Vitamin D deficiency 03/17/2010  . Osteopenia 03/17/2010  . 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 IBS 10/10/2007  . THYROID NODULE, HX OF 03/04/2007    Current Outpatient Medications on File Prior to Visit  Medication Sig Dispense Refill  . atorvastatin (LIPITOR) 20 MG tablet TAKE 1 TABLET BY MOUTH EVERY DAY 90 tablet 1  . Calcium 500 MG tablet Take 1 tablet (500 mg total) by mouth daily. 90 tablet   . cholecalciferol (VITAMIN D) 1000 units tablet Take 1 tablet (1,000 Units total) by mouth daily.    Marland Kitchen levothyroxine (SYNTHROID) 100 MCG tablet Take 1 tablet (100 mcg total) by mouth daily. Need office visit for more refills 30 tablet 0   No  current facility-administered medications on file prior to visit.    Past Medical History:  Diagnosis Date  . Helicobacter pylori gastritis 2001  . IBS (irritable bowel syndrome)   . 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 Pituitary abnormality ([place]) 1990   Enlarged sella; Dr Sherwood Gambler    Past Surgical History:  Procedure Laterality Date  . APPENDECTOMY    . COLONOSCOPY W/ POLYPECTOMY  2010   [facility] ( as per her son)but possibly in [place] 2011  . 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 Marital status: Married    Spouse name: Not on file  . Number of children: Not on file  . Years of education: Not on file  . Highest education level: Not on file  Occupational History  . Not on file  Tobacco Use  . Smoking status: Never Smoker  . Smokeless tobacco: Never Used  Substance and Sexual Activity  . Alcohol use: No  . Drug use: No  . Sexual activity: Yes    Partners: Female  Other Topics Concern  . Not on file  Social History Narrative  . Not on file   Social Determinants of Health   Financial Resource Strain:   .  Difficulty of Paying Living Expenses: Not on file  Food Insecurity:   . Worried About Charity fundraiser in the Last Year: Not on file  . Ran Out of Food in the Last Year: Not on file  Transportation Needs:   . Lack of Transportation (Medical): Not on file  . Lack of Transportation (Non-Medical): Not on file  Physical Activity:   . Days of Exercise per Week: Not on file  . Minutes of Exercise per Session: Not on file  Stress:   . Feeling of Stress : Not on file  Social Connections:   . Frequency of Communication with Friends and Family: Not on file  . Frequency of Social Gatherings with Friends and Family: Not on file  . Attends Religious Services: Not on file  . Active Member of Clubs or Organizations: Not on file  . Attends Archivist Meetings:  Not on file  . Marital Status: Not on file    Family History  Problem Relation Age of Onset  . Heart disease Neg Hx   . Stroke Neg Hx   . Diabetes Neg Hx   . Cancer Father         ? primary    Review of Systems  Constitutional: Negative for chills and fever.  Respiratory: Positive for cough (occasional dry cough) and shortness of breath (sometimes). Negative for wheezing.   Cardiovascular: Positive for chest pain (sometimes - last month when walking and stretching, ) and palpitations (sometimes). Negative for leg swelling.  Neurological: Positive for dizziness (sometimes). Negative for light-headedness and headaches.       Objective:   Vitals:   07/11/19 0809  BP: 120/82  Pulse: 63  Temp: 98.1 F (36.7 C)  SpO2: 96%   BP Readings from Last 3 Encounters:  07/11/19 120/82  06/04/18 (!) 142/80  01/15/18 120/80   Wt Readings from Last 3 Encounters:  07/11/19 147 lb (66.7 kg)  06/04/18 136 lb (61.7 kg)  01/15/18 129 lb (58.5 kg)   Body mass index is 26.04 kg/m.   Physical Exam    Constitutional: Appears well-developed and well-nourished. No distress.  HENT:  Head: Normocephalic and atraumatic.  Neck: Neck supple. No tracheal deviation present. No thyromegaly present.  No cervical lymphadenopathy Cardiovascular: Normal rate, regular rhythm and normal heart sounds.   No murmur heard. No carotid bruit .  No edema Pulmonary/Chest: Effort normal and breath sounds normal. No respiratory distress. No has no wheezes. No rales. Abdomen: Soft, nontender, nondistended Skin: Skin is warm and dry. Not diaphoretic.  Psychiatric: Normal mood and affect. Behavior is normal.      Assessment & Plan:    See Problem List for Assessment and Plan of chronic medical problems.     This visit occurred during the SARS-CoV-2 public health emergency.  Safety protocols were in place, including screening questions prior to the visit, additional usage of staff PPE, and extensive  cleaning of exam room while observing appropriate contact time as indicated for disinfecting solutions.

## 2019-07-10 NOTE — Assessment & Plan Note (Addendum)
H/o hurthle cell changes, TSH has been kept at a low level Has had some weight gain We will check TSH and titrate medication if necessary

## 2019-07-10 NOTE — Patient Instructions (Addendum)
  Tests ordered today. Your results will be released to [place] (or called to you) after review.  If any changes need to be made, you will be notified at that same time.   Medications reviewed and updated.  Changes include :   none    Please followup in 1 year

## 2019-07-11 ENCOUNTER — Encounter: Payer: Self-pay | Admitting: Internal Medicine

## 2019-07-11 ENCOUNTER — Other Ambulatory Visit (INDEPENDENT_AMBULATORY_CARE_PROVIDER_SITE_OTHER): Payer: Self-pay

## 2019-07-11 ENCOUNTER — Ambulatory Visit (INDEPENDENT_AMBULATORY_CARE_PROVIDER_SITE_OTHER): Payer: Self-pay | Admitting: Internal Medicine

## 2019-07-11 ENCOUNTER — Other Ambulatory Visit: Payer: Self-pay

## 2019-07-11 VITALS — BP 120/82 | HR 63 | Temp 98.1°F | Ht 63.0 in | Wt 147.0 lb

## 2019-07-11 DIAGNOSIS — I2510 Atherosclerotic heart disease of native coronary artery without angina pectoris: Secondary | ICD-10-CM

## 2019-07-11 DIAGNOSIS — E7849 Other hyperlipidemia: Secondary | ICD-10-CM

## 2019-07-11 DIAGNOSIS — R739 Hyperglycemia, unspecified: Secondary | ICD-10-CM

## 2019-07-11 DIAGNOSIS — E890 Postprocedural hypothyroidism: Secondary | ICD-10-CM

## 2019-07-11 DIAGNOSIS — R079 Chest pain, unspecified: Secondary | ICD-10-CM

## 2019-07-11 LAB — COMPREHENSIVE METABOLIC PANEL
ALT: 19 U/L (ref 0–35)
AST: 18 U/L (ref 0–37)
Albumin: 4.3 g/dL (ref 3.5–5.2)
Alkaline Phosphatase: 71 U/L (ref 39–117)
BUN: 18 mg/dL (ref 6–23)
CO2: 29 mEq/L (ref 19–32)
Calcium: 9.2 mg/dL (ref 8.4–10.5)
Chloride: 104 mEq/L (ref 96–112)
Creatinine, Ser: 0.72 mg/dL (ref 0.40–1.20)
GFR: 81.34 mL/min (ref >60.00)
Glucose, Bld: 104 mg/dL — ABNORMAL HIGH (ref 70–99)
Potassium: 4 mEq/L (ref 3.5–5.1)
Sodium: 139 mEq/L (ref 135–145)
Total Bilirubin: 0.7 mg/dL (ref 0.2–1.2)
Total Protein: 7 g/dL (ref 6.0–8.3)

## 2019-07-11 LAB — CBC WITH DIFFERENTIAL/PLATELET
Basophils Absolute: 0 10*3/uL (ref 0.0–0.1)
Basophils Relative: 0.7 % (ref 0.0–3.0)
Eosinophils Absolute: 0.1 10*3/uL (ref 0.0–0.7)
Eosinophils Relative: 1.6 % (ref 0.0–5.0)
HCT: 42.5 % (ref 36.0–46.0)
Hemoglobin: 14.3 g/dL (ref 12.0–15.0)
Lymphocytes Relative: 37 % (ref 12.0–46.0)
Lymphs Abs: 2.4 10*3/uL (ref 0.7–4.0)
MCHC: 33.6 g/dL (ref 30.0–36.0)
MCV: 91.7 fl (ref 78.0–100.0)
Monocytes Absolute: 0.6 10*3/uL (ref 0.1–1.0)
Monocytes Relative: 8.6 % (ref 3.0–12.0)
Neutro Abs: 3.4 10*3/uL (ref 1.4–7.7)
Neutrophils Relative %: 52.1 % (ref 43.0–77.0)
Platelets: 228 10*3/uL (ref 150.0–400.0)
RBC: 4.64 Mil/uL (ref 3.87–5.11)
RDW: 13.1 % (ref 11.5–15.5)
WBC: 6.5 10*3/uL (ref 4.0–10.5)

## 2019-07-11 LAB — LIPID PANEL
Cholesterol: 203 mg/dL — ABNORMAL HIGH (ref 0–200)
HDL: 52.1 mg/dL (ref >39.00)
LDL Cholesterol: 120 mg/dL — ABNORMAL HIGH (ref 0–99)
NonHDL: 151.39
Total CHOL/HDL Ratio: 4
Triglycerides: 159 mg/dL — ABNORMAL HIGH (ref 0.0–149.0)
VLDL: 31.8 mg/dL (ref 0.0–40.0)

## 2019-07-11 LAB — HEMOGLOBIN A1C: Hgb A1c MFr Bld: 5.8 % (ref 4.6–6.5)

## 2019-07-11 LAB — TSH: TSH: 0.01 u[IU]/mL — ABNORMAL LOW (ref 0.35–4.50)

## 2019-07-11 NOTE — Assessment & Plan Note (Signed)
She states she has had some chest pain-started last month and has not had any recently Sounds atypical in nature, but she is not able to describe well She does exercise regularly Has some evidence of CAD on CT calcium test from [place], but no testing done here Discussed that if she continues to experience chest pain that she needs to let me know and I will refer to cardiology for further evaluation.  She agrees

## 2019-07-11 NOTE — Assessment & Plan Note (Signed)
A1c Continue healthy diet and regular exercise

## 2019-07-11 NOTE — Assessment & Plan Note (Signed)
Not taking statin-has not had side effects Discussed that she does have evidence of mild coronary artery disease based on the test done in [place] and should be taking the statin Will check lipid panel

## 2019-07-11 NOTE — Assessment & Plan Note (Signed)
Had some chest pain that sounds atypical and not CAD/angina CAD from CT scan done in [place] testing done here today Advised that she does need to take her Will check lipid panel, CMP, CBC, TSH

## 2019-07-12 ENCOUNTER — Encounter: Payer: Self-pay | Admitting: Internal Medicine

## 2019-07-29 ENCOUNTER — Other Ambulatory Visit: Payer: Self-pay | Admitting: Internal Medicine

## 2019-10-28 ENCOUNTER — Other Ambulatory Visit: Payer: Self-pay | Admitting: Internal Medicine

## 2020-01-14 DIAGNOSIS — H401212 Low-tension glaucoma, right eye, moderate stage: Secondary | ICD-10-CM | POA: Diagnosis not present

## 2020-01-14 DIAGNOSIS — H401221 Low-tension glaucoma, left eye, mild stage: Secondary | ICD-10-CM | POA: Diagnosis not present

## 2020-01-14 DIAGNOSIS — H2513 Age-related nuclear cataract, bilateral: Secondary | ICD-10-CM | POA: Diagnosis not present

## 2020-01-14 DIAGNOSIS — H02052 Trichiasis without entropian right lower eyelid: Secondary | ICD-10-CM | POA: Diagnosis not present

## 2020-01-29 DIAGNOSIS — Z0000 Encounter for general adult medical examination without abnormal findings: Secondary | ICD-10-CM | POA: Diagnosis not present

## 2020-01-29 DIAGNOSIS — R7303 Prediabetes: Secondary | ICD-10-CM | POA: Diagnosis not present

## 2020-01-29 DIAGNOSIS — E890 Postprocedural hypothyroidism: Secondary | ICD-10-CM | POA: Diagnosis not present

## 2020-01-30 DIAGNOSIS — E890 Postprocedural hypothyroidism: Secondary | ICD-10-CM | POA: Diagnosis not present

## 2020-01-30 DIAGNOSIS — Z0000 Encounter for general adult medical examination without abnormal findings: Secondary | ICD-10-CM | POA: Diagnosis not present

## 2020-01-30 DIAGNOSIS — R7303 Prediabetes: Secondary | ICD-10-CM | POA: Diagnosis not present

## 2020-02-04 DIAGNOSIS — Z0000 Encounter for general adult medical examination without abnormal findings: Secondary | ICD-10-CM | POA: Diagnosis not present

## 2020-02-04 DIAGNOSIS — E890 Postprocedural hypothyroidism: Secondary | ICD-10-CM | POA: Diagnosis not present

## 2020-02-04 DIAGNOSIS — Z23 Encounter for immunization: Secondary | ICD-10-CM | POA: Diagnosis not present

## 2020-02-04 DIAGNOSIS — K2970 Gastritis, unspecified, without bleeding: Secondary | ICD-10-CM | POA: Diagnosis not present

## 2020-02-04 DIAGNOSIS — S81809A Unspecified open wound, unspecified lower leg, initial encounter: Secondary | ICD-10-CM | POA: Diagnosis not present

## 2020-02-11 DIAGNOSIS — K219 Gastro-esophageal reflux disease without esophagitis: Secondary | ICD-10-CM | POA: Diagnosis not present

## 2020-02-11 DIAGNOSIS — E039 Hypothyroidism, unspecified: Secondary | ICD-10-CM | POA: Diagnosis not present

## 2020-02-11 DIAGNOSIS — M810 Age-related osteoporosis without current pathological fracture: Secondary | ICD-10-CM | POA: Diagnosis not present

## 2020-02-13 DIAGNOSIS — Z1231 Encounter for screening mammogram for malignant neoplasm of breast: Secondary | ICD-10-CM | POA: Diagnosis not present

## 2020-03-04 DIAGNOSIS — R1013 Epigastric pain: Secondary | ICD-10-CM | POA: Diagnosis not present

## 2020-03-04 DIAGNOSIS — K219 Gastro-esophageal reflux disease without esophagitis: Secondary | ICD-10-CM | POA: Diagnosis not present

## 2020-03-04 DIAGNOSIS — K2940 Chronic atrophic gastritis without bleeding: Secondary | ICD-10-CM | POA: Diagnosis not present

## 2020-03-04 DIAGNOSIS — K2970 Gastritis, unspecified, without bleeding: Secondary | ICD-10-CM | POA: Diagnosis not present

## 2020-03-04 DIAGNOSIS — B9681 Helicobacter pylori [H. pylori] as the cause of diseases classified elsewhere: Secondary | ICD-10-CM | POA: Diagnosis not present

## 2020-03-04 DIAGNOSIS — K3189 Other diseases of stomach and duodenum: Secondary | ICD-10-CM | POA: Diagnosis not present

## 2020-04-13 DIAGNOSIS — A048 Other specified bacterial intestinal infections: Secondary | ICD-10-CM | POA: Diagnosis not present

## 2020-05-08 ENCOUNTER — Ambulatory Visit: Payer: Self-pay | Attending: Internal Medicine

## 2020-05-08 DIAGNOSIS — Z23 Encounter for immunization: Secondary | ICD-10-CM

## 2020-05-08 NOTE — Progress Notes (Signed)
   Covid-19 Vaccination Clinic  Name:  [name]    MRN: [id number] DOB: 1956-06-29  05/08/2020  [name] was observed post Covid-19 immunization for 15 minutes without incident. She was provided with Vaccine Information Sheet and instruction to access the V-Safe system.   [name] was instructed to call 911 with any severe reactions post vaccine: Marland Kitchen Difficulty breathing  . Swelling of face and throat  . A fast heartbeat  . A bad rash all over body  . Dizziness and weakness

## 2020-05-31 DIAGNOSIS — K219 Gastro-esophageal reflux disease without esophagitis: Secondary | ICD-10-CM | POA: Diagnosis not present

## 2020-05-31 DIAGNOSIS — K2970 Gastritis, unspecified, without bleeding: Secondary | ICD-10-CM | POA: Diagnosis not present

## 2020-06-09 DIAGNOSIS — H02052 Trichiasis without entropian right lower eyelid: Secondary | ICD-10-CM | POA: Diagnosis not present

## 2020-06-09 DIAGNOSIS — H401212 Low-tension glaucoma, right eye, moderate stage: Secondary | ICD-10-CM | POA: Diagnosis not present

## 2020-06-09 DIAGNOSIS — H401221 Low-tension glaucoma, left eye, mild stage: Secondary | ICD-10-CM | POA: Diagnosis not present

## 2020-06-09 DIAGNOSIS — H2513 Age-related nuclear cataract, bilateral: Secondary | ICD-10-CM | POA: Diagnosis not present

## 2020-07-08 DIAGNOSIS — Z23 Encounter for immunization: Secondary | ICD-10-CM | POA: Diagnosis not present

## 2020-10-13 DIAGNOSIS — H2513 Age-related nuclear cataract, bilateral: Secondary | ICD-10-CM | POA: Diagnosis not present

## 2020-10-13 DIAGNOSIS — H02052 Trichiasis without entropian right lower eyelid: Secondary | ICD-10-CM | POA: Diagnosis not present

## 2020-10-13 DIAGNOSIS — H401212 Low-tension glaucoma, right eye, moderate stage: Secondary | ICD-10-CM | POA: Diagnosis not present

## 2020-10-13 DIAGNOSIS — H401221 Low-tension glaucoma, left eye, mild stage: Secondary | ICD-10-CM | POA: Diagnosis not present

## 2021-01-25 DIAGNOSIS — R7309 Other abnormal glucose: Secondary | ICD-10-CM | POA: Diagnosis not present

## 2021-01-25 DIAGNOSIS — Z0000 Encounter for general adult medical examination without abnormal findings: Secondary | ICD-10-CM | POA: Diagnosis not present

## 2021-01-25 DIAGNOSIS — E890 Postprocedural hypothyroidism: Secondary | ICD-10-CM | POA: Diagnosis not present

## 2021-02-07 DIAGNOSIS — D229 Melanocytic nevi, unspecified: Secondary | ICD-10-CM | POA: Diagnosis not present

## 2021-02-07 DIAGNOSIS — Z0000 Encounter for general adult medical examination without abnormal findings: Secondary | ICD-10-CM | POA: Diagnosis not present

## 2021-02-07 DIAGNOSIS — Z23 Encounter for immunization: Secondary | ICD-10-CM | POA: Diagnosis not present

## 2021-02-07 DIAGNOSIS — E890 Postprocedural hypothyroidism: Secondary | ICD-10-CM | POA: Diagnosis not present

## 2021-02-07 DIAGNOSIS — M8580 Other specified disorders of bone density and structure, unspecified site: Secondary | ICD-10-CM | POA: Diagnosis not present

## 2021-02-07 DIAGNOSIS — R7303 Prediabetes: Secondary | ICD-10-CM | POA: Diagnosis not present

## 2021-02-18 DIAGNOSIS — Z1231 Encounter for screening mammogram for malignant neoplasm of breast: Secondary | ICD-10-CM | POA: Diagnosis not present

## 2021-03-02 DIAGNOSIS — H401212 Low-tension glaucoma, right eye, moderate stage: Secondary | ICD-10-CM | POA: Diagnosis not present

## 2021-03-02 DIAGNOSIS — H401221 Low-tension glaucoma, left eye, mild stage: Secondary | ICD-10-CM | POA: Diagnosis not present

## 2021-04-04 DIAGNOSIS — K2940 Chronic atrophic gastritis without bleeding: Secondary | ICD-10-CM | POA: Diagnosis not present

## 2021-04-04 DIAGNOSIS — K219 Gastro-esophageal reflux disease without esophagitis: Secondary | ICD-10-CM | POA: Diagnosis not present

## 2021-04-04 DIAGNOSIS — K2970 Gastritis, unspecified, without bleeding: Secondary | ICD-10-CM | POA: Diagnosis not present

## 2021-04-13 DIAGNOSIS — H10433 Chronic follicular conjunctivitis, bilateral: Secondary | ICD-10-CM | POA: Diagnosis not present

## 2021-04-13 DIAGNOSIS — H401212 Low-tension glaucoma, right eye, moderate stage: Secondary | ICD-10-CM | POA: Diagnosis not present

## 2021-04-13 DIAGNOSIS — H401221 Low-tension glaucoma, left eye, mild stage: Secondary | ICD-10-CM | POA: Diagnosis not present

## 2021-04-21 DIAGNOSIS — K3189 Other diseases of stomach and duodenum: Secondary | ICD-10-CM | POA: Diagnosis not present

## 2021-04-21 DIAGNOSIS — K31A12 Gastric intestinal metaplasia without dysplasia, involving the body (corpus): Secondary | ICD-10-CM | POA: Diagnosis not present

## 2021-04-21 DIAGNOSIS — K2950 Unspecified chronic gastritis without bleeding: Secondary | ICD-10-CM | POA: Diagnosis not present

## 2021-04-21 DIAGNOSIS — K31A11 Gastric intestinal metaplasia without dysplasia, involving the antrum: Secondary | ICD-10-CM | POA: Diagnosis not present

## 2021-04-21 DIAGNOSIS — K2940 Chronic atrophic gastritis without bleeding: Secondary | ICD-10-CM | POA: Diagnosis not present

## 2021-04-21 DIAGNOSIS — K2970 Gastritis, unspecified, without bleeding: Secondary | ICD-10-CM | POA: Diagnosis not present

## 2021-06-08 DIAGNOSIS — H2513 Age-related nuclear cataract, bilateral: Secondary | ICD-10-CM | POA: Diagnosis not present

## 2021-06-08 DIAGNOSIS — H43813 Vitreous degeneration, bilateral: Secondary | ICD-10-CM | POA: Diagnosis not present

## 2021-06-08 DIAGNOSIS — H401212 Low-tension glaucoma, right eye, moderate stage: Secondary | ICD-10-CM | POA: Diagnosis not present

## 2021-06-08 DIAGNOSIS — H401221 Low-tension glaucoma, left eye, mild stage: Secondary | ICD-10-CM | POA: Diagnosis not present

## 2021-06-15 DIAGNOSIS — E7800 Pure hypercholesterolemia, unspecified: Secondary | ICD-10-CM | POA: Diagnosis not present

## 2021-06-15 DIAGNOSIS — R42 Dizziness and giddiness: Secondary | ICD-10-CM | POA: Diagnosis not present

## 2021-06-15 DIAGNOSIS — R519 Headache, unspecified: Secondary | ICD-10-CM | POA: Diagnosis not present

## 2021-06-21 ENCOUNTER — Other Ambulatory Visit: Payer: Self-pay | Admitting: Family Medicine

## 2021-06-21 DIAGNOSIS — R42 Dizziness and giddiness: Secondary | ICD-10-CM

## 2021-06-21 DIAGNOSIS — R2689 Other abnormalities of gait and mobility: Secondary | ICD-10-CM

## 2021-07-04 DIAGNOSIS — R112 Nausea with vomiting, unspecified: Secondary | ICD-10-CM | POA: Diagnosis not present

## 2021-07-04 DIAGNOSIS — K2970 Gastritis, unspecified, without bleeding: Secondary | ICD-10-CM | POA: Diagnosis not present

## 2021-07-04 DIAGNOSIS — R42 Dizziness and giddiness: Secondary | ICD-10-CM | POA: Diagnosis not present

## 2021-07-06 ENCOUNTER — Ambulatory Visit
Admission: RE | Admit: 2021-07-06 | Discharge: 2021-07-06 | Disposition: A | Discharge status: Home / Self Care | Payer: Self-pay | Source: Ambulatory Visit | Attending: Family Medicine | Admitting: Family Medicine

## 2021-07-06 ENCOUNTER — Other Ambulatory Visit: Payer: Self-pay

## 2021-07-06 DIAGNOSIS — R220 Localized swelling, mass and lump, head: Secondary | ICD-10-CM | POA: Diagnosis not present

## 2021-07-06 DIAGNOSIS — M79601 Pain in right arm: Secondary | ICD-10-CM | POA: Diagnosis not present

## 2021-07-06 DIAGNOSIS — R42 Dizziness and giddiness: Secondary | ICD-10-CM

## 2021-07-06 DIAGNOSIS — R519 Headache, unspecified: Secondary | ICD-10-CM | POA: Diagnosis not present

## 2021-07-06 DIAGNOSIS — R2689 Other abnormalities of gait and mobility: Secondary | ICD-10-CM

## 2021-07-06 IMAGING — MR MR HEAD WO/W CM
13 series · 48 of 48 positions shown · IV contrast (multihance)
Comparison: None.

CLINICAL DATA: Posterior headache and balance disturbance. Right
arm pain.

EXAM:
MRI HEAD WITHOUT AND WITH CONTRAST
TECHNIQUE: Multiplanar, multiecho pulse sequences of the brain and surrounding
structures were obtained without and with intravenous contrast.
CONTRAST:  13mL MULTIHANCE GADOBENATE DIMEGLUMINE 529 MG/ML IV SOLN

[Series 2: T1 · sagittal · 5.0mm · 0.45mm/px · 2 of 24 slices shown]
[im 1/24]
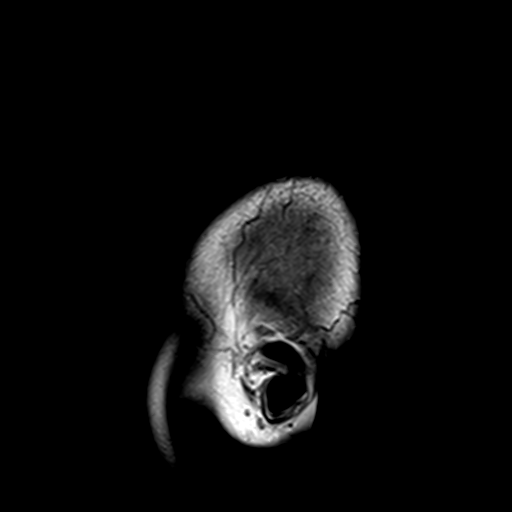
[im 24/24]
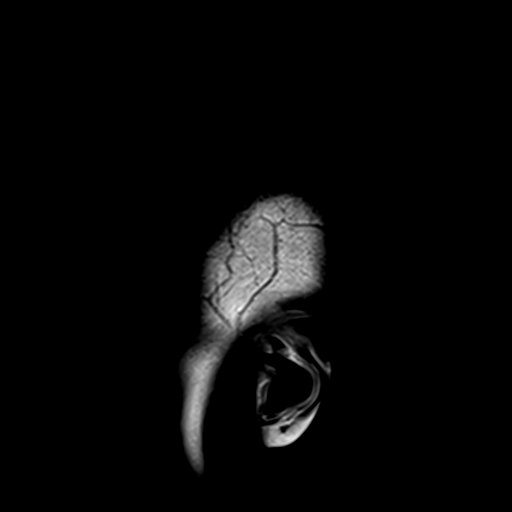

[Series 3: DWI · axial · 3.0mm · 1.80mm/px · z∈[-111,+42]mm · 7 of 106 slices shown (1 of 4)]
[im 1/106]
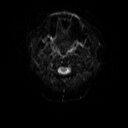
[im 18/106]
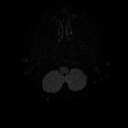
[im 36/106]
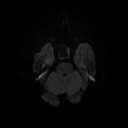
[im 53/106]
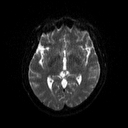
[im 71/106]
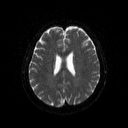
[im 88/106]
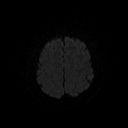
[im 106/106]
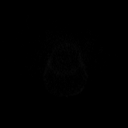

[Series 4: DWI · axial · 3.0mm · 1.80mm/px · z∈[-111,+42]mm · 3 of 53 slices shown (2 of 4)]
[im 1/53]
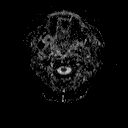
[im 27/53]
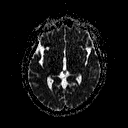
[im 53/53]
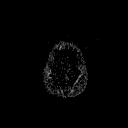

[Series 5: DWI · coronal · 5.0mm · 1.80mm/px · 4 of 68 slices shown (3 of 4)]
[im 1/68]
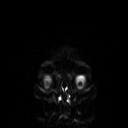
[im 23/68]
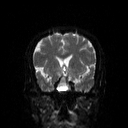
[im 45/68]
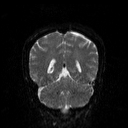
[im 68/68]
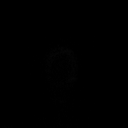

[Series 6: DWI · coronal · 5.0mm · 1.80mm/px · 2 of 34 slices shown (4 of 4)]
[im 1/34]
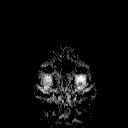
[im 34/34]
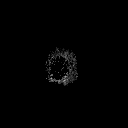

[Series 7: T2 · axial · 5.0mm · 0.60mm/px · z∈[-118,+49]mm · 2 of 26 slices shown (1 of 2)]
[im 1/26]
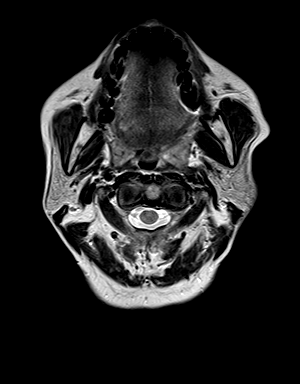
[im 26/26]
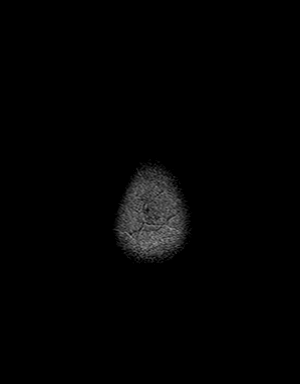

[Series 8: FLAIR · axial · 3.0mm · 0.45mm/px · z∈[-110,+41]mm · 2 of 34 slices shown]
[im 1/34]
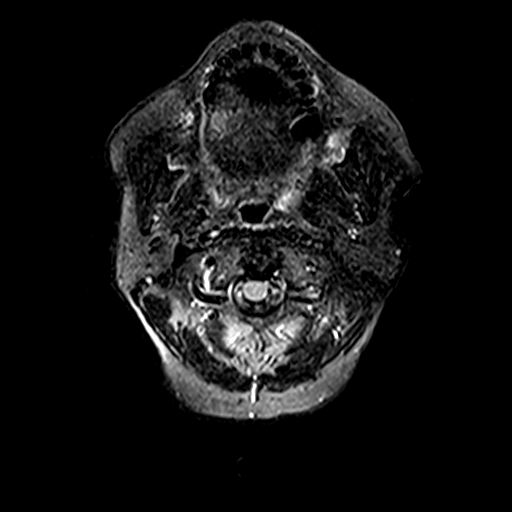
[im 34/34]
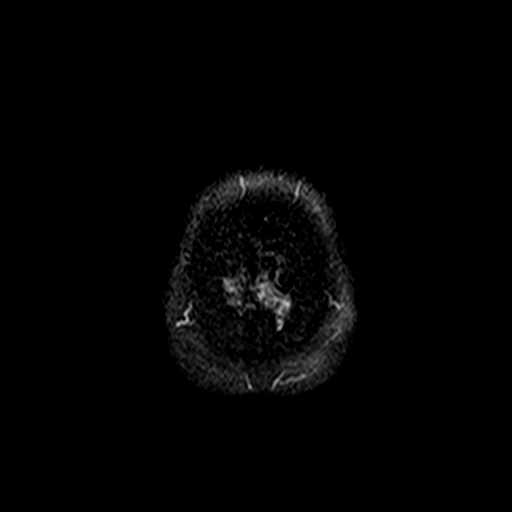

[Series 10: swi_images · axial · 4.0mm · 0.90mm/px · z∈[-104,+35]mm · 2 of 36 slices shown]
[im 1/36]
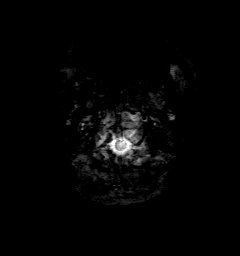
[im 36/36]
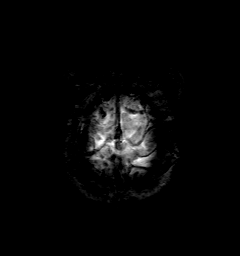

[Series 11: t1_mpr_tra · axial · 1.0mm · 0.75mm/px · z∈[-106,+35]mm · 9 of 144 slices shown]
[im 1/144]
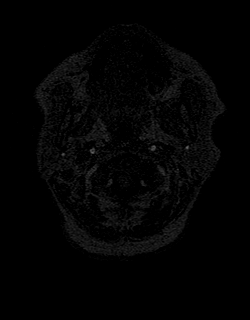
[im 18/144]
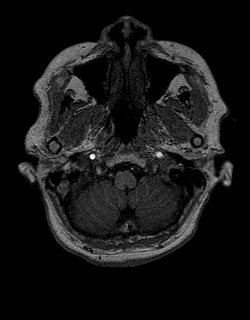
[im 36/144]
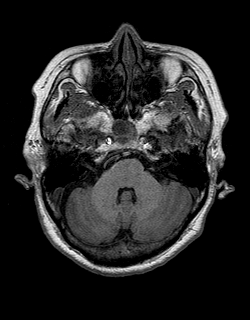
[im 54/144]
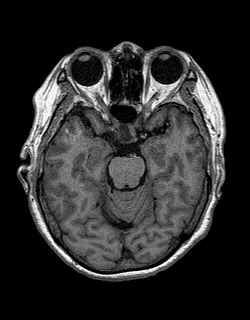
[im 72/144]
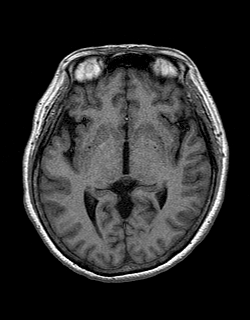
[im 90/144]
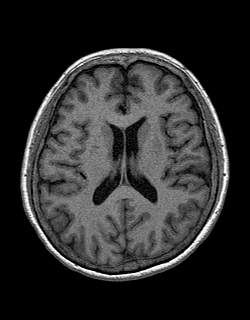
[im 108/144]
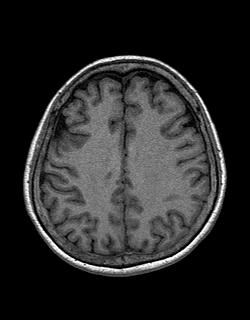
[im 126/144]
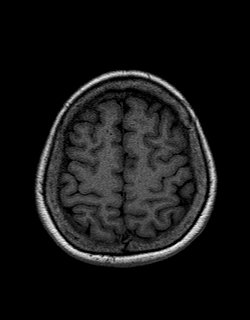
[im 144/144]
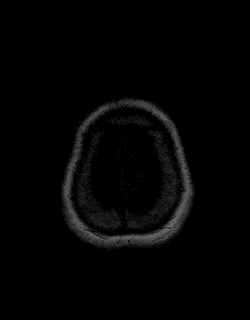

[Series 12: T2 · coronal · 5.0mm · 0.45mm/px · 2 of 25 slices shown (2 of 2)]
[im 1/25]
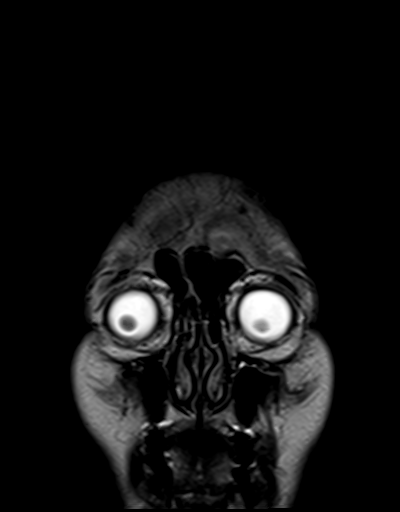
[im 25/25]
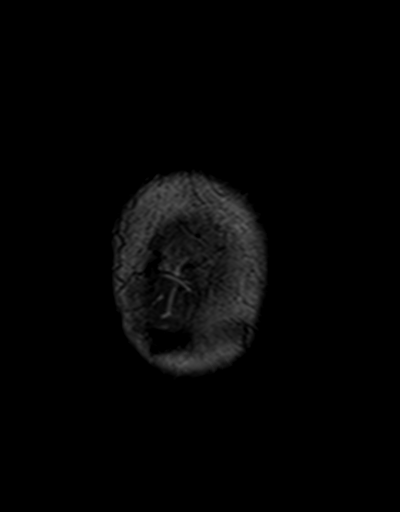

[Series 13: t1_mpr_tra post · axial · 1.0mm · 0.75mm/px · z∈[-106,+35]mm · 9 of 144 slices shown]
[im 1/144]
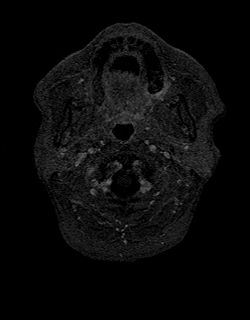
[im 18/144]
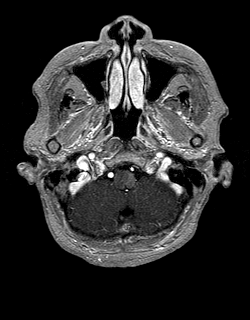
[im 36/144]
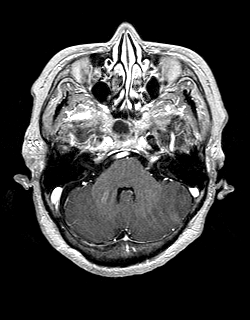
[im 54/144]
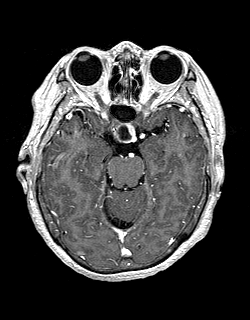
[im 72/144]
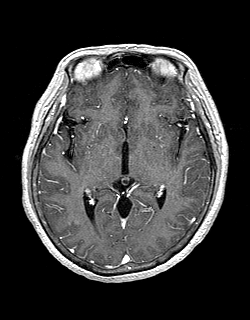
[im 90/144]
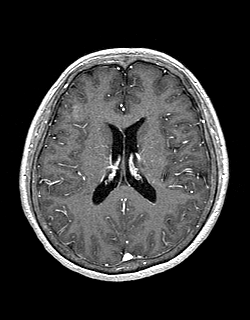
[im 108/144]
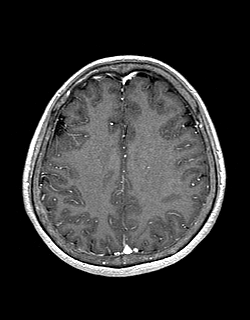
[im 126/144]
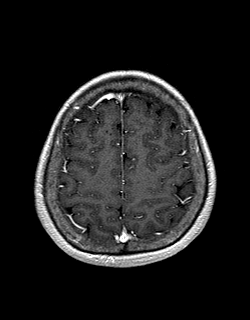
[im 144/144]
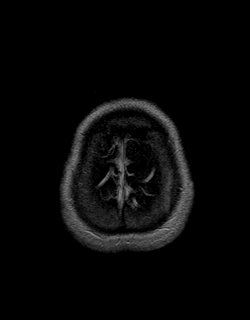

[Series 14: post cor · coronal · 5.0mm · 0.45mm/px · 2 of 25 slices shown]
[im 1/25]
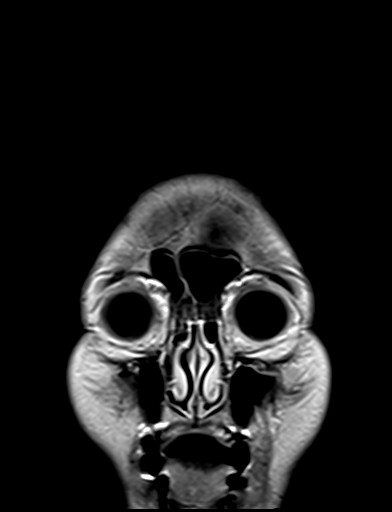
[im 25/25]
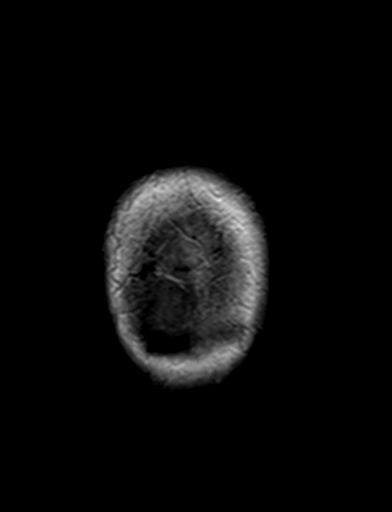

[Series 15: T1 post-contrast · sagittal · 5.0mm · 0.45mm/px · 2 of 24 slices shown]
[im 1/24]
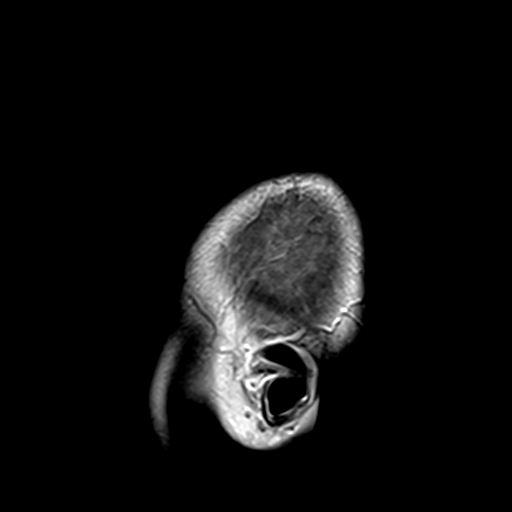
[im 24/24]
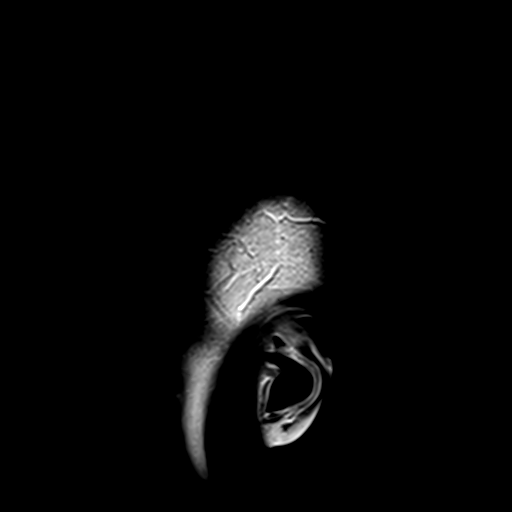

[48 of 48 positions shown; findings below may reference images not displayed]

FINDINGS: Brain: The brain itself has a normal appearance without evidence of
atrophy, old or acute small or large vessel infarction, intra-axial
mass lesion, hemorrhage, hydrocephalus or extra-axial collection.
There is a centrally necrotic mass extending from the sellar to the
suprasellar region with a cephalo caudal measurement of 2.7 cm, AP
dimension of 2.2 cm, and right-to-left dimension of 2.6 cm, with
sellar remodeling, consistent with necrotic pituitary adenoma. This
has mild mass-effect upon the optic chiasm. No evidence of cavernous
sinus extension.

Vascular: Major vessels at the base of the brain show flow.

Skull and upper cervical spine: Negative

Sinuses/Orbits: Clear/normal

Other: None
IMPRESSION: Normal appearance of the brain itself.

2.7 x 2.2 x 2.6 cm sellar and suprasellar mass with features
consistent with a necrotic pituitary adenoma. No evidence of
cavernous sinus extension. Mild mass-effect upon the optic chiasm.

## 2021-07-06 MED ORDER — GADOBENATE DIMEGLUMINE 529 MG/ML IV SOLN
13.0000 mL | Freq: Once | INTRAVENOUS | Status: AC | PRN
  Administered 2021-07-06: 12:00:00 13 mL via INTRAVENOUS

## 2021-07-10 DIAGNOSIS — R12 Heartburn: Secondary | ICD-10-CM | POA: Diagnosis not present

## 2021-07-10 DIAGNOSIS — R10816 Epigastric abdominal tenderness: Secondary | ICD-10-CM | POA: Diagnosis not present

## 2021-07-10 DIAGNOSIS — R112 Nausea with vomiting, unspecified: Secondary | ICD-10-CM | POA: Diagnosis not present

## 2021-07-10 DIAGNOSIS — R1013 Epigastric pain: Secondary | ICD-10-CM | POA: Diagnosis not present

## 2021-07-10 DIAGNOSIS — R000 Tachycardia, unspecified: Secondary | ICD-10-CM | POA: Diagnosis not present

## 2021-07-10 DIAGNOSIS — R1110 Vomiting, unspecified: Secondary | ICD-10-CM | POA: Diagnosis not present

## 2021-07-10 DIAGNOSIS — E860 Dehydration: Secondary | ICD-10-CM | POA: Diagnosis not present

## 2021-07-10 DIAGNOSIS — R079 Chest pain, unspecified: Secondary | ICD-10-CM | POA: Diagnosis not present

## 2021-07-11 DIAGNOSIS — R000 Tachycardia, unspecified: Secondary | ICD-10-CM | POA: Diagnosis not present

## 2021-07-14 DIAGNOSIS — E2740 Unspecified adrenocortical insufficiency: Secondary | ICD-10-CM | POA: Diagnosis not present

## 2021-07-14 DIAGNOSIS — R42 Dizziness and giddiness: Secondary | ICD-10-CM | POA: Diagnosis not present

## 2021-07-14 DIAGNOSIS — E890 Postprocedural hypothyroidism: Secondary | ICD-10-CM | POA: Diagnosis not present

## 2021-07-14 DIAGNOSIS — Z6821 Body mass index (BMI) 21.0-21.9, adult: Secondary | ICD-10-CM | POA: Diagnosis not present

## 2021-07-14 DIAGNOSIS — D497 Neoplasm of unspecified behavior of endocrine glands and other parts of nervous system: Secondary | ICD-10-CM | POA: Diagnosis not present

## 2021-07-14 DIAGNOSIS — D352 Benign neoplasm of pituitary gland: Secondary | ICD-10-CM | POA: Diagnosis not present

## 2021-07-14 DIAGNOSIS — E230 Hypopituitarism: Secondary | ICD-10-CM | POA: Diagnosis not present

## 2021-07-14 DIAGNOSIS — E038 Other specified hypothyroidism: Secondary | ICD-10-CM | POA: Diagnosis not present

## 2021-07-14 DIAGNOSIS — H409 Unspecified glaucoma: Secondary | ICD-10-CM | POA: Diagnosis not present

## 2021-07-27 DIAGNOSIS — H401221 Low-tension glaucoma, left eye, mild stage: Secondary | ICD-10-CM | POA: Diagnosis not present

## 2021-07-27 DIAGNOSIS — D352 Benign neoplasm of pituitary gland: Secondary | ICD-10-CM | POA: Diagnosis not present

## 2021-07-27 DIAGNOSIS — H2513 Age-related nuclear cataract, bilateral: Secondary | ICD-10-CM | POA: Diagnosis not present

## 2021-07-27 DIAGNOSIS — H401212 Low-tension glaucoma, right eye, moderate stage: Secondary | ICD-10-CM | POA: Diagnosis not present

## 2021-07-27 DIAGNOSIS — H43813 Vitreous degeneration, bilateral: Secondary | ICD-10-CM | POA: Diagnosis not present

## 2021-08-01 DIAGNOSIS — D497 Neoplasm of unspecified behavior of endocrine glands and other parts of nervous system: Secondary | ICD-10-CM | POA: Diagnosis not present

## 2021-08-02 ENCOUNTER — Other Ambulatory Visit: Payer: Self-pay | Admitting: Neurosurgery

## 2021-08-02 DIAGNOSIS — E038 Other specified hypothyroidism: Secondary | ICD-10-CM | POA: Diagnosis not present

## 2021-08-02 DIAGNOSIS — E890 Postprocedural hypothyroidism: Secondary | ICD-10-CM | POA: Diagnosis not present

## 2021-08-02 DIAGNOSIS — E7800 Pure hypercholesterolemia, unspecified: Secondary | ICD-10-CM | POA: Diagnosis not present

## 2021-08-02 DIAGNOSIS — E230 Hypopituitarism: Secondary | ICD-10-CM | POA: Diagnosis not present

## 2021-08-02 DIAGNOSIS — E2740 Unspecified adrenocortical insufficiency: Secondary | ICD-10-CM | POA: Diagnosis not present

## 2021-08-02 DIAGNOSIS — H409 Unspecified glaucoma: Secondary | ICD-10-CM | POA: Diagnosis not present

## 2021-08-02 DIAGNOSIS — R7303 Prediabetes: Secondary | ICD-10-CM | POA: Diagnosis not present

## 2021-08-02 DIAGNOSIS — R519 Headache, unspecified: Secondary | ICD-10-CM | POA: Diagnosis not present

## 2021-08-02 DIAGNOSIS — D352 Benign neoplasm of pituitary gland: Secondary | ICD-10-CM | POA: Diagnosis not present

## 2021-08-02 DIAGNOSIS — Z6821 Body mass index (BMI) 21.0-21.9, adult: Secondary | ICD-10-CM | POA: Diagnosis not present

## 2021-08-02 DIAGNOSIS — D497 Neoplasm of unspecified behavior of endocrine glands and other parts of nervous system: Secondary | ICD-10-CM

## 2021-08-09 DIAGNOSIS — R7303 Prediabetes: Secondary | ICD-10-CM | POA: Diagnosis not present

## 2021-08-09 DIAGNOSIS — E7800 Pure hypercholesterolemia, unspecified: Secondary | ICD-10-CM | POA: Diagnosis not present

## 2021-08-09 DIAGNOSIS — E890 Postprocedural hypothyroidism: Secondary | ICD-10-CM | POA: Diagnosis not present

## 2021-08-16 DIAGNOSIS — Z0000 Encounter for general adult medical examination without abnormal findings: Secondary | ICD-10-CM | POA: Diagnosis not present

## 2021-08-16 DIAGNOSIS — E038 Other specified hypothyroidism: Secondary | ICD-10-CM | POA: Diagnosis not present

## 2021-08-16 DIAGNOSIS — D352 Benign neoplasm of pituitary gland: Secondary | ICD-10-CM | POA: Diagnosis not present

## 2021-08-16 DIAGNOSIS — R7303 Prediabetes: Secondary | ICD-10-CM | POA: Diagnosis not present

## 2021-08-16 DIAGNOSIS — K5900 Constipation, unspecified: Secondary | ICD-10-CM | POA: Diagnosis not present

## 2021-08-16 DIAGNOSIS — H409 Unspecified glaucoma: Secondary | ICD-10-CM | POA: Diagnosis not present

## 2021-08-16 DIAGNOSIS — Z8719 Personal history of other diseases of the digestive system: Secondary | ICD-10-CM | POA: Diagnosis not present

## 2021-08-16 DIAGNOSIS — E781 Pure hyperglyceridemia: Secondary | ICD-10-CM | POA: Diagnosis not present

## 2021-08-29 ENCOUNTER — Other Ambulatory Visit: Payer: Self-pay

## 2021-08-29 ENCOUNTER — Ambulatory Visit
Admission: RE | Admit: 2021-08-29 | Discharge: 2021-08-29 | Disposition: A | Discharge status: Home / Self Care | Payer: Medicare Other | Source: Ambulatory Visit | Attending: Neurosurgery | Admitting: Neurosurgery

## 2021-08-29 DIAGNOSIS — I771 Stricture of artery: Secondary | ICD-10-CM | POA: Diagnosis not present

## 2021-08-29 DIAGNOSIS — E237 Disorder of pituitary gland, unspecified: Secondary | ICD-10-CM | POA: Diagnosis not present

## 2021-08-29 DIAGNOSIS — R519 Headache, unspecified: Secondary | ICD-10-CM | POA: Diagnosis not present

## 2021-08-29 DIAGNOSIS — D497 Neoplasm of unspecified behavior of endocrine glands and other parts of nervous system: Secondary | ICD-10-CM

## 2021-08-29 DIAGNOSIS — M79601 Pain in right arm: Secondary | ICD-10-CM | POA: Diagnosis not present

## 2021-08-29 IMAGING — MR MR HEAD WO/W CM
11 of 20 series · 27 of 48 positions shown · IV contrast (10ML multihance)
Comparison: [DATE].

CLINICAL DATA: 65-year-old female with a history of headache,
balance disturbance, right arm pain. 2.7 cm pituitary/suprasellar
mass on MRI in [REDACTED] favored to be pituitary adenoma. Subsequent
encounter.

EXAM:
MRI HEAD WITHOUT AND WITH CONTRAST
TECHNIQUE: Multiplanar, multiecho pulse sequences of the brain and surrounding
structures were obtained without and with intravenous contrast.
CONTRAST:  10mL MULTIHANCE GADOBENATE DIMEGLUMINE 529 MG/ML IV SOLN

[Series 2: T1 · sagittal · 5.0mm · 0.45mm/px · 1 of 22 slices shown (1 of 3)]
[im 1/22]
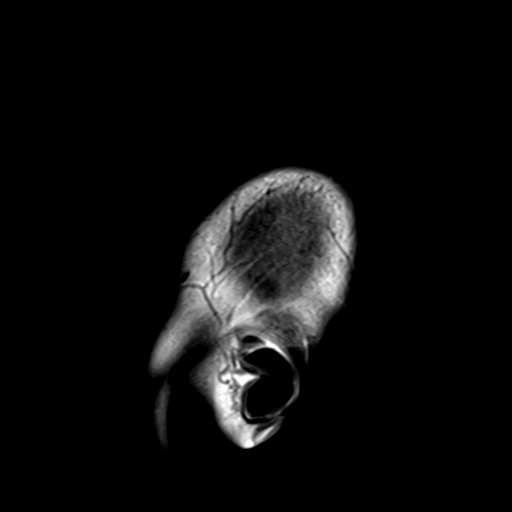

[Series 3: DWI · axial · 3.0mm · 1.80mm/px · z∈[-62,+91]mm · 9 of 104 slices shown]
[im 1/104]
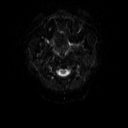
[im 13/104]
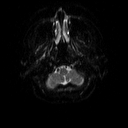
[im 26/104]
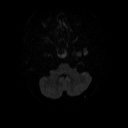
[im 39/104]
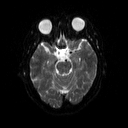
[im 52/104]
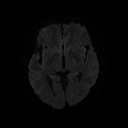
[im 65/104]
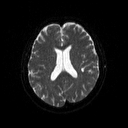
[im 78/104]
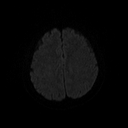
[im 91/104]
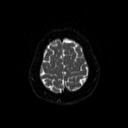
[im 104/104]
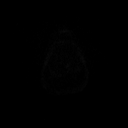

[Series 4: dwi_adc · axial · 3.0mm · 1.80mm/px · z∈[-62,-26]mm · 2 of 52 slices shown]
[im 1/52]
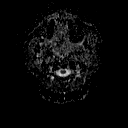
[im 13/52]
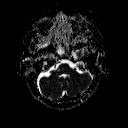

[Series 5: T2 · axial · 5.0mm · 0.36mm/px · z∈[-64,+92]mm · 3 of 25 slices shown (1 of 2)]
[im 1/25]
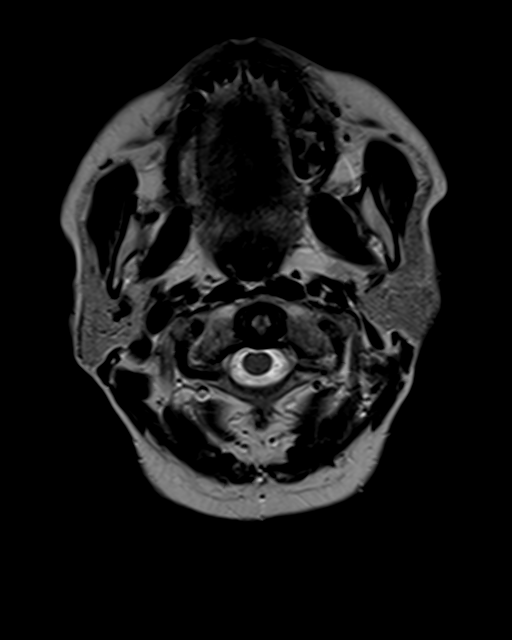
[im 13/25]
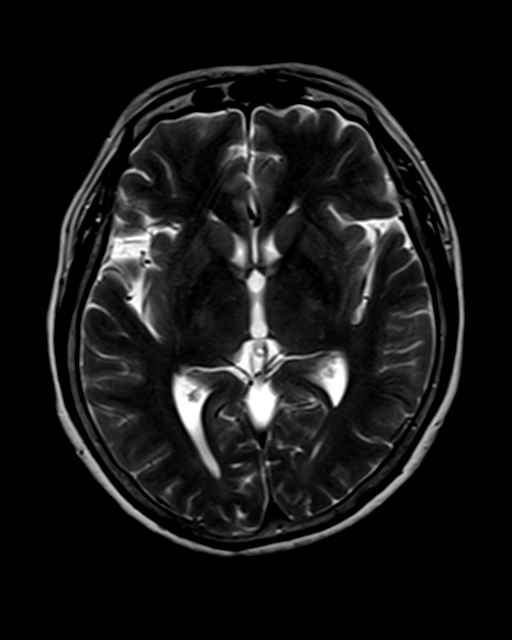
[im 25/25]
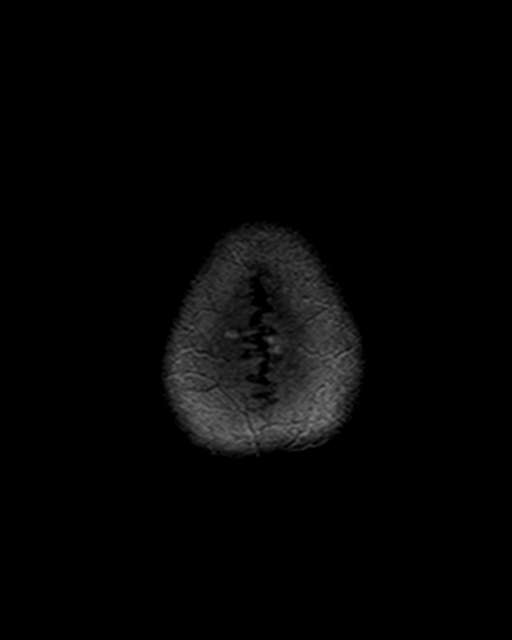

[Series 6: FLAIR · axial · 3.0mm · 0.45mm/px · z∈[-65,+93]mm · 4 of 35 slices shown]
[im 1/35]
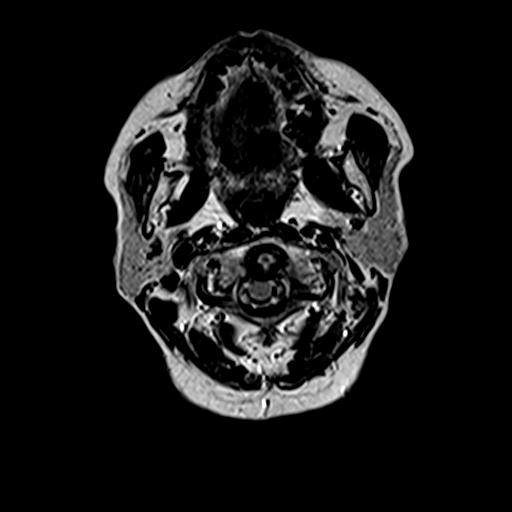
[im 12/35]
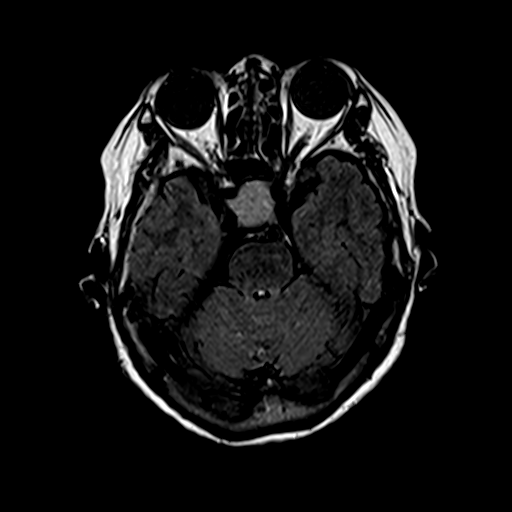
[im 23/35]
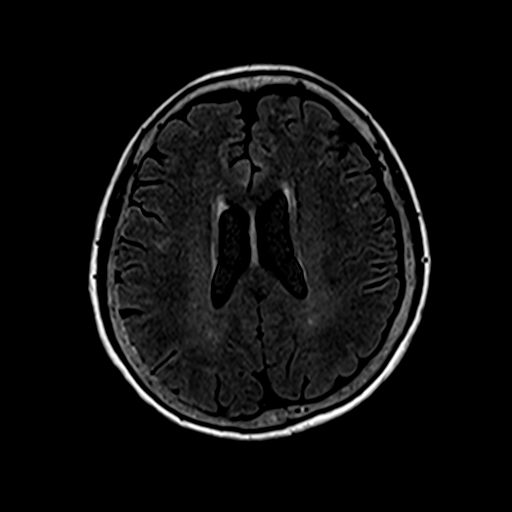
[im 35/35]
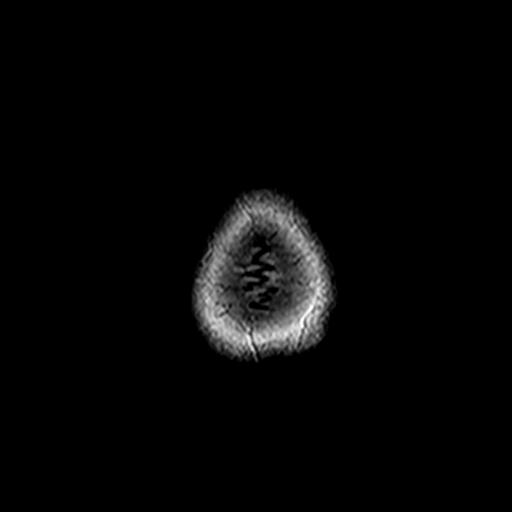

[Series 9: T1 · sagittal · 3.0mm · 0.33mm/px · 1 of 11 slices shown (2 of 3)]
[im 1/11]
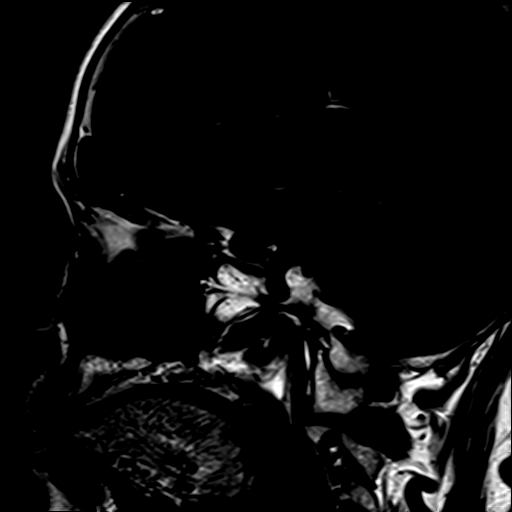

[Series 10: T1 · coronal · 3.0mm · 0.33mm/px · 1 of 11 slices shown (3 of 3)]
[im 1/11]
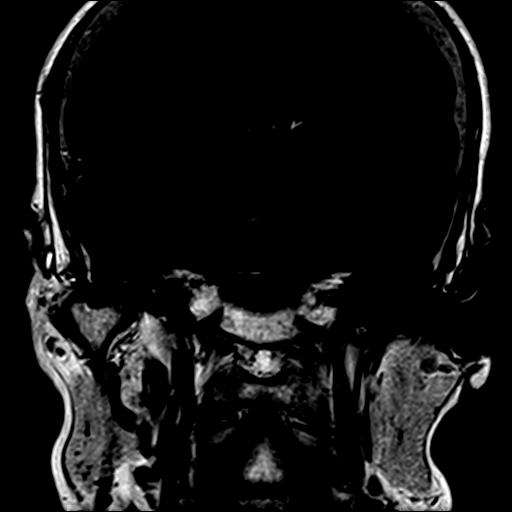

[Series 11: T2 · coronal · 3.0mm · 0.23mm/px · 1 of 11 slices shown (2 of 2)]
[im 1/11]
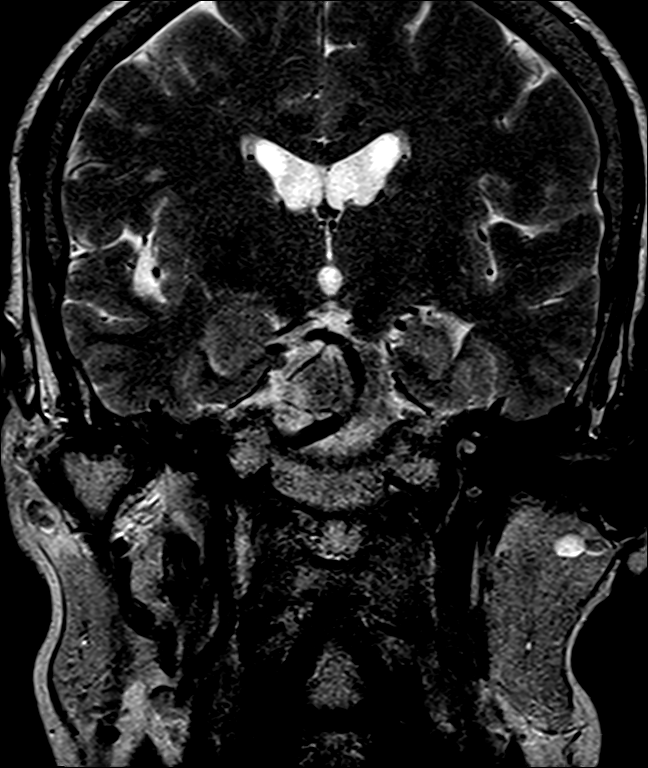

[Series 19: T1 post-contrast · sagittal · 3.0mm · 0.33mm/px · 1 of 11 slices shown (1 of 3)]
[im 1/11]
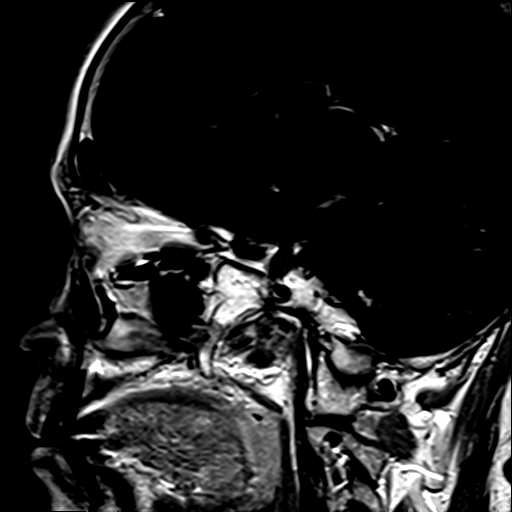

[Series 20: T1 post-contrast · coronal · 3.0mm · 0.33mm/px · 1 of 11 slices shown (2 of 3)]
[im 1/11]
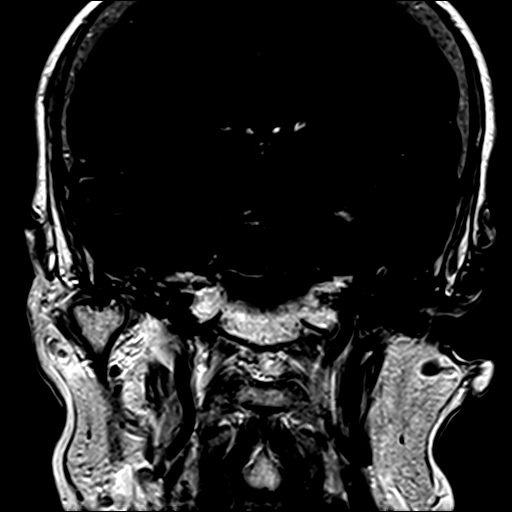

[Series 22: T1 post-contrast · coronal · 5.0mm · 0.45mm/px · 3 of 26 slices shown (3 of 3)]
[im 1/26]
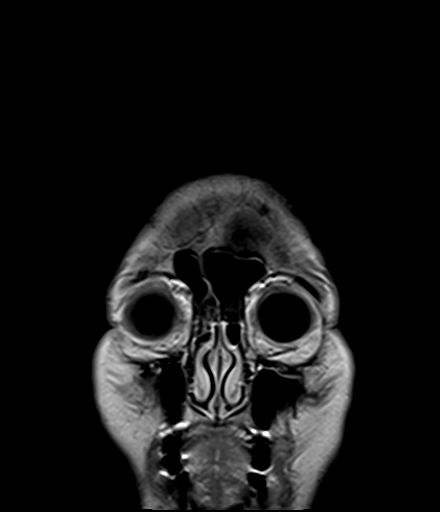
[im 13/26]
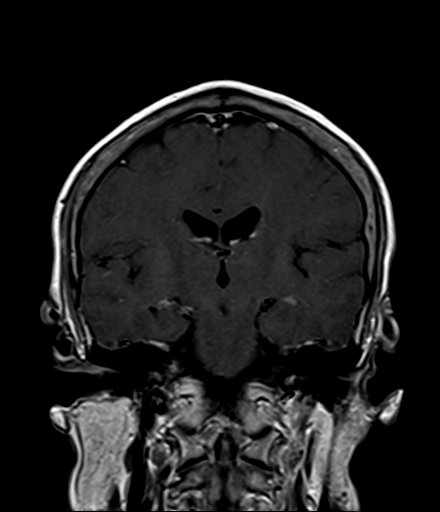
[im 26/26]
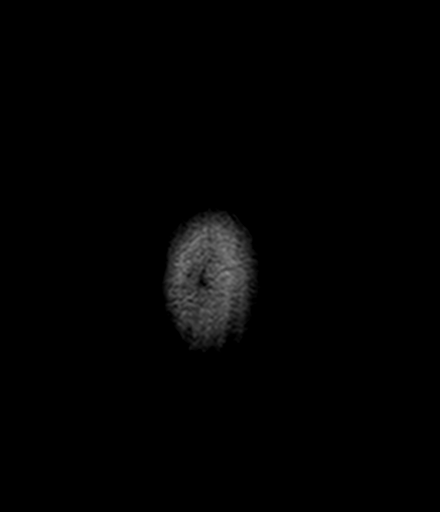

[27 of 48 positions shown; findings below may reference images not displayed]

FINDINGS: Brain: Pituitary details are below.

No superimposed No restricted diffusion to suggest acute infarction.
No midline shift, mass effect, evidence of mass lesion,
ventriculomegaly, extra-axial collection or acute intracranial
hemorrhage. Cervicomedullary junction is within normal limits.
Cerebral volume is within normal limits for age. Gray and white
matter signal throughout the brain is stable and within normal
limits for age.

No abnormal gray or white matter enhancement identified. No dural
thickening.

Vascular: Major intracranial vascular flow voids are stable. Mild
generalized intracranial artery tortuosity. Dominant right vertebral
artery.

Skull and upper cervical spine: Smooth remodeling of the bony sella
and clivus with no skull base infiltration. Normal background bone
marrow signal. Negative visible cervical spine.

Sinuses/Orbits: Stable mild suprasellar mass effect including the
undersurface of the optic chiasm (series 11, image 7). Negative
orbits. Paranasal Visualized paranasal sinuses and mastoids are
stable and well aerated. Small round 7 mm T2 hyperintense nodule of
the left parotid gland (series 11, image 2) demonstrates little to
no enhancement. This is stable with benign etiology favored such as
small benign mixed tumor or cyst.

Other: Dedicated thin slice pre and postcontrast pituitary imaging.
Smoothly remodeled, expanded bony sella turcica with circumscribed,
predominantly cystic/necrotic mass with nodular rim enhancement
extending from the sella into the suprasellar cistern. Non
restricted internal cystic or necrotic contents. 25 x 28 x 25 mm (AP
by transverse by CC), stable when measured with the same technique
in [REDACTED]. The pituitary infundibulum is deviated to the left and
inserts on the left superolateral margin of the mass (series 20,
image 5). Early right side sinus extension (series 20, image 6).
Meckel's cave remain normal. ICA siphons are deviated laterally. No
other regional abnormal enhancement.
IMPRESSION: 1. Stable since [REDACTED] predominantly cystic or necrotic
intra-sellar mass with suprasellar extension most compatible with
pituitary adenoma up to 2.8 cm. Stable regional mass effect.
Possible early right side cavernous sinus extension. Smoothly
remodeled bony sella with no skull base infiltration.
2. No new intracranial abnormality.

## 2021-08-29 MED ORDER — GADOBENATE DIMEGLUMINE 529 MG/ML IV SOLN
10.0000 mL | Freq: Once | INTRAVENOUS | Status: AC | PRN
  Administered 2021-08-29: 10 mL via INTRAVENOUS

## 2021-09-06 DIAGNOSIS — D352 Benign neoplasm of pituitary gland: Secondary | ICD-10-CM | POA: Diagnosis not present

## 2021-09-06 DIAGNOSIS — E038 Other specified hypothyroidism: Secondary | ICD-10-CM | POA: Diagnosis not present

## 2021-09-06 DIAGNOSIS — E2740 Unspecified adrenocortical insufficiency: Secondary | ICD-10-CM | POA: Diagnosis not present

## 2021-09-06 DIAGNOSIS — E039 Hypothyroidism, unspecified: Secondary | ICD-10-CM | POA: Diagnosis not present

## 2021-09-12 DIAGNOSIS — D497 Neoplasm of unspecified behavior of endocrine glands and other parts of nervous system: Secondary | ICD-10-CM | POA: Diagnosis not present

## 2021-09-13 DIAGNOSIS — E230 Hypopituitarism: Secondary | ICD-10-CM | POA: Diagnosis not present

## 2021-09-13 DIAGNOSIS — E2740 Unspecified adrenocortical insufficiency: Secondary | ICD-10-CM | POA: Diagnosis not present

## 2021-09-13 DIAGNOSIS — Z6821 Body mass index (BMI) 21.0-21.9, adult: Secondary | ICD-10-CM | POA: Diagnosis not present

## 2021-09-13 DIAGNOSIS — E038 Other specified hypothyroidism: Secondary | ICD-10-CM | POA: Diagnosis not present

## 2021-09-13 DIAGNOSIS — D352 Benign neoplasm of pituitary gland: Secondary | ICD-10-CM | POA: Diagnosis not present

## 2021-09-13 DIAGNOSIS — R7303 Prediabetes: Secondary | ICD-10-CM | POA: Diagnosis not present

## 2021-09-13 DIAGNOSIS — E890 Postprocedural hypothyroidism: Secondary | ICD-10-CM | POA: Diagnosis not present

## 2021-10-04 DIAGNOSIS — R519 Headache, unspecified: Secondary | ICD-10-CM | POA: Diagnosis not present

## 2021-10-04 DIAGNOSIS — Z6821 Body mass index (BMI) 21.0-21.9, adult: Secondary | ICD-10-CM | POA: Diagnosis not present

## 2021-10-04 DIAGNOSIS — R7303 Prediabetes: Secondary | ICD-10-CM | POA: Diagnosis not present

## 2021-10-04 DIAGNOSIS — D352 Benign neoplasm of pituitary gland: Secondary | ICD-10-CM | POA: Diagnosis not present

## 2021-10-04 DIAGNOSIS — E230 Hypopituitarism: Secondary | ICD-10-CM | POA: Diagnosis not present

## 2021-10-04 DIAGNOSIS — E038 Other specified hypothyroidism: Secondary | ICD-10-CM | POA: Diagnosis not present

## 2021-10-04 DIAGNOSIS — E2740 Unspecified adrenocortical insufficiency: Secondary | ICD-10-CM | POA: Diagnosis not present

## 2021-10-04 DIAGNOSIS — E890 Postprocedural hypothyroidism: Secondary | ICD-10-CM | POA: Diagnosis not present

## 2021-10-24 DIAGNOSIS — D352 Benign neoplasm of pituitary gland: Secondary | ICD-10-CM | POA: Diagnosis not present

## 2021-11-07 ENCOUNTER — Other Ambulatory Visit: Payer: Self-pay | Admitting: Neurosurgery

## 2021-11-07 DIAGNOSIS — D497 Neoplasm of unspecified behavior of endocrine glands and other parts of nervous system: Secondary | ICD-10-CM

## 2021-11-10 DIAGNOSIS — D352 Benign neoplasm of pituitary gland: Secondary | ICD-10-CM | POA: Diagnosis not present

## 2021-11-17 DIAGNOSIS — E230 Hypopituitarism: Secondary | ICD-10-CM | POA: Diagnosis not present

## 2021-11-17 DIAGNOSIS — Z6821 Body mass index (BMI) 21.0-21.9, adult: Secondary | ICD-10-CM | POA: Diagnosis not present

## 2021-11-17 DIAGNOSIS — E2740 Unspecified adrenocortical insufficiency: Secondary | ICD-10-CM | POA: Diagnosis not present

## 2021-11-17 DIAGNOSIS — E038 Other specified hypothyroidism: Secondary | ICD-10-CM | POA: Diagnosis not present

## 2021-11-17 DIAGNOSIS — D352 Benign neoplasm of pituitary gland: Secondary | ICD-10-CM | POA: Diagnosis not present

## 2021-11-17 DIAGNOSIS — E890 Postprocedural hypothyroidism: Secondary | ICD-10-CM | POA: Diagnosis not present

## 2021-11-17 DIAGNOSIS — R519 Headache, unspecified: Secondary | ICD-10-CM | POA: Diagnosis not present

## 2021-11-17 DIAGNOSIS — R7303 Prediabetes: Secondary | ICD-10-CM | POA: Diagnosis not present

## 2021-11-29 ENCOUNTER — Other Ambulatory Visit: Payer: Medicare Other

## 2021-12-05 DIAGNOSIS — H2513 Age-related nuclear cataract, bilateral: Secondary | ICD-10-CM | POA: Diagnosis not present

## 2021-12-05 DIAGNOSIS — H401212 Low-tension glaucoma, right eye, moderate stage: Secondary | ICD-10-CM | POA: Diagnosis not present

## 2021-12-05 DIAGNOSIS — H401221 Low-tension glaucoma, left eye, mild stage: Secondary | ICD-10-CM | POA: Diagnosis not present

## 2021-12-05 DIAGNOSIS — H10433 Chronic follicular conjunctivitis, bilateral: Secondary | ICD-10-CM | POA: Diagnosis not present

## 2022-01-26 ENCOUNTER — Other Ambulatory Visit: Payer: Medicare Other

## 2022-02-03 DIAGNOSIS — D352 Benign neoplasm of pituitary gland: Secondary | ICD-10-CM | POA: Diagnosis not present

## 2022-02-06 DIAGNOSIS — Z0000 Encounter for general adult medical examination without abnormal findings: Secondary | ICD-10-CM | POA: Diagnosis not present

## 2022-02-06 DIAGNOSIS — Z8719 Personal history of other diseases of the digestive system: Secondary | ICD-10-CM | POA: Diagnosis not present

## 2022-02-06 DIAGNOSIS — R7303 Prediabetes: Secondary | ICD-10-CM | POA: Diagnosis not present

## 2022-02-06 DIAGNOSIS — D352 Benign neoplasm of pituitary gland: Secondary | ICD-10-CM | POA: Diagnosis not present

## 2022-02-06 DIAGNOSIS — E781 Pure hyperglyceridemia: Secondary | ICD-10-CM | POA: Diagnosis not present

## 2022-02-13 DIAGNOSIS — K2970 Gastritis, unspecified, without bleeding: Secondary | ICD-10-CM | POA: Diagnosis not present

## 2022-02-13 DIAGNOSIS — E237 Disorder of pituitary gland, unspecified: Secondary | ICD-10-CM | POA: Diagnosis not present

## 2022-02-13 DIAGNOSIS — E7800 Pure hypercholesterolemia, unspecified: Secondary | ICD-10-CM | POA: Diagnosis not present

## 2022-02-13 DIAGNOSIS — E890 Postprocedural hypothyroidism: Secondary | ICD-10-CM | POA: Diagnosis not present

## 2022-02-13 DIAGNOSIS — Z0000 Encounter for general adult medical examination without abnormal findings: Secondary | ICD-10-CM | POA: Diagnosis not present

## 2022-02-13 DIAGNOSIS — R7303 Prediabetes: Secondary | ICD-10-CM | POA: Diagnosis not present

## 2022-02-13 DIAGNOSIS — Z8739 Personal history of other diseases of the musculoskeletal system and connective tissue: Secondary | ICD-10-CM | POA: Diagnosis not present

## 2022-02-13 DIAGNOSIS — M8589 Other specified disorders of bone density and structure, multiple sites: Secondary | ICD-10-CM | POA: Diagnosis not present

## 2022-02-13 DIAGNOSIS — E2740 Unspecified adrenocortical insufficiency: Secondary | ICD-10-CM | POA: Diagnosis not present

## 2022-02-13 DIAGNOSIS — E230 Hypopituitarism: Secondary | ICD-10-CM | POA: Diagnosis not present

## 2022-02-15 DIAGNOSIS — D352 Benign neoplasm of pituitary gland: Secondary | ICD-10-CM | POA: Diagnosis not present

## 2022-02-15 DIAGNOSIS — E230 Hypopituitarism: Secondary | ICD-10-CM | POA: Diagnosis not present

## 2022-02-15 DIAGNOSIS — E2740 Unspecified adrenocortical insufficiency: Secondary | ICD-10-CM | POA: Diagnosis not present

## 2022-02-15 DIAGNOSIS — M810 Age-related osteoporosis without current pathological fracture: Secondary | ICD-10-CM | POA: Diagnosis not present

## 2022-02-15 DIAGNOSIS — E038 Other specified hypothyroidism: Secondary | ICD-10-CM | POA: Diagnosis not present

## 2022-02-17 ENCOUNTER — Encounter (HOSPITAL_COMMUNITY): Payer: Self-pay | Admitting: Emergency Medicine

## 2022-02-17 ENCOUNTER — Emergency Department (HOSPITAL_COMMUNITY)
Admission: EM | Admit: 2022-02-17 | Discharge: 2022-02-17 | Disposition: A | Discharge status: Home / Self Care | Payer: Medicare Other | Attending: Emergency Medicine | Admitting: Emergency Medicine

## 2022-02-17 ENCOUNTER — Other Ambulatory Visit: Payer: Self-pay

## 2022-02-17 ENCOUNTER — Emergency Department (HOSPITAL_COMMUNITY): Payer: Medicare Other

## 2022-02-17 DIAGNOSIS — C751 Malignant neoplasm of pituitary gland: Secondary | ICD-10-CM | POA: Diagnosis not present

## 2022-02-17 DIAGNOSIS — Z79899 Other long term (current) drug therapy: Secondary | ICD-10-CM | POA: Insufficient documentation

## 2022-02-17 DIAGNOSIS — D443 Neoplasm of uncertain behavior of pituitary gland: Secondary | ICD-10-CM | POA: Insufficient documentation

## 2022-02-17 DIAGNOSIS — R112 Nausea with vomiting, unspecified: Secondary | ICD-10-CM | POA: Diagnosis not present

## 2022-02-17 DIAGNOSIS — D497 Neoplasm of unspecified behavior of endocrine glands and other parts of nervous system: Secondary | ICD-10-CM

## 2022-02-17 DIAGNOSIS — R519 Headache, unspecified: Secondary | ICD-10-CM

## 2022-02-17 LAB — COMPREHENSIVE METABOLIC PANEL
ALT: 25 U/L (ref 0–44)
AST: 21 U/L (ref 15–41)
Albumin: 4.3 g/dL (ref 3.5–5.0)
Alkaline Phosphatase: 71 U/L (ref 38–126)
Anion gap: 7 (ref 5–15)
BUN: 19 mg/dL (ref 8–23)
CO2: 27 mmol/L (ref 22–32)
Calcium: 9.5 mg/dL (ref 8.9–10.3)
Chloride: 101 mmol/L (ref 98–111)
Creatinine, Ser: 0.85 mg/dL (ref 0.44–1.00)
GFR, Estimated: >60 mL/min (ref >60)
Glucose, Bld: 96 mg/dL (ref 70–99)
Potassium: 3.8 mmol/L (ref 3.5–5.1)
Sodium: 135 mmol/L (ref 135–145)
Total Bilirubin: 0.6 mg/dL (ref 0.3–1.2)
Total Protein: 6.8 g/dL (ref 6.5–8.1)

## 2022-02-17 LAB — CBC WITH DIFFERENTIAL/PLATELET
Abs Immature Granulocytes: 0.02 10*3/uL (ref 0.00–0.07)
Basophils Absolute: 0 10*3/uL (ref 0.0–0.1)
Basophils Relative: 1 %
Eosinophils Absolute: 0.1 10*3/uL (ref 0.0–0.5)
Eosinophils Relative: 1 %
HCT: 46.9 % — ABNORMAL HIGH (ref 36.0–46.0)
Hemoglobin: 16 g/dL — ABNORMAL HIGH (ref 12.0–15.0)
Immature Granulocytes: 0 %
Lymphocytes Relative: 31 %
Lymphs Abs: 2.3 10*3/uL (ref 0.7–4.0)
MCH: 31.4 pg (ref 26.0–34.0)
MCHC: 34.1 g/dL (ref 30.0–36.0)
MCV: 92 fL (ref 80.0–100.0)
Monocytes Absolute: 0.5 10*3/uL (ref 0.1–1.0)
Monocytes Relative: 6 %
Neutro Abs: 4.6 10*3/uL (ref 1.7–7.7)
Neutrophils Relative %: 61 %
Platelets: 216 10*3/uL (ref 150–400)
RBC: 5.1 MIL/uL (ref 3.87–5.11)
RDW: 12.8 % (ref 11.5–15.5)
WBC: 7.4 10*3/uL (ref 4.0–10.5)
nRBC: 0 % (ref 0.0–0.2)

## 2022-02-17 LAB — URINALYSIS, ROUTINE W REFLEX MICROSCOPIC
Bilirubin Urine: NEGATIVE
Glucose, UA: NEGATIVE mg/dL
Hgb urine dipstick: NEGATIVE
Ketones, ur: 5 mg/dL — AB
Leukocytes,Ua: NEGATIVE
Nitrite: NEGATIVE
Protein, ur: NEGATIVE mg/dL
Specific Gravity, Urine: 1.008 (ref 1.005–1.030)
pH: 6 (ref 5.0–8.0)

## 2022-02-17 LAB — CBG MONITORING, ED: Glucose-Capillary: 100 mg/dL — ABNORMAL HIGH (ref 70–99)

## 2022-02-17 LAB — LIPASE, BLOOD: Lipase: 36 U/L (ref 11–51)

## 2022-02-17 MED ORDER — PROCHLORPERAZINE EDISYLATE 10 MG/2ML IJ SOLN
10.0000 mg | Freq: Once | INTRAMUSCULAR | Status: AC
  Administered 2022-02-17: 10 mg via INTRAVENOUS
  Filled 2022-02-17: qty 2

## 2022-02-17 MED ORDER — SODIUM CHLORIDE 0.9 % IV SOLN: 1000.0000 mL | INTRAVENOUS | Status: DC

## 2022-02-17 MED ORDER — KETOROLAC TROMETHAMINE 15 MG/ML IJ SOLN
15.0000 mg | Freq: Once | INTRAMUSCULAR | Status: AC
  Administered 2022-02-17: 15 mg via INTRAVENOUS
  Filled 2022-02-17: qty 1

## 2022-02-17 MED ORDER — SODIUM CHLORIDE 0.9 % IV BOLUS (SEPSIS)
1000.0000 mL | Freq: Once | INTRAVENOUS | Status: AC
  Administered 2022-02-17: 1000 mL via INTRAVENOUS

## 2022-02-17 MED ORDER — GADOBUTROL 1 MMOL/ML IV SOLN
6.5000 mL | Freq: Once | INTRAVENOUS | Status: AC | PRN
  Administered 2022-02-17: 6.5 mL via INTRAVENOUS

## 2022-02-17 MED ORDER — NAPROXEN 375 MG PO TABS: 375.0000 mg | ORAL_TABLET | Freq: Two times a day (BID) | ORAL | 0 refills | Status: DC

## 2022-02-17 MED ORDER — ONDANSETRON 8 MG PO TBDP: 8.0000 mg | ORAL_TABLET | Freq: Three times a day (TID) | ORAL | 0 refills | Status: DC | PRN

## 2022-02-17 NOTE — ED Provider Triage Note (Signed)
Emergency Medicine Provider Triage Evaluation Note  [name] , a 66 y.o. female  was evaluated in triage.  Pt complains of decreased appetite. Used professional [place] interpreter, still may have some language barrier as to her presentation of symptoms..  She complains of decreased appetite over the past 2 days.  She has had ongoing nausea without vomiting.  Additionally she has had intermittent headache since yesterday.  The family at bedside states that they called her neurosurgeon since they had decreased her medication Cabergoline.  They stated that she could increase her doses and take prednisone.  She states that she did this but still had headache and nausea.  She denies any changes to her vision, fevers, vomiting, diarrhea, abdominal pain.  Review of Systems  Positive:  Negative:   Physical Exam  BP 138/85 (BP Location: Right Arm)   Pulse (!) 59   Temp 97.6 F (36.4 C) (Oral)   Resp 18   SpO2 96%  Gen:   Awake, no distress   Resp:  Normal effort  MSK:   Moves extremities without difficulty  Other:  Nonfocal neuro exam  Medical Decision Making  Medically screening exam initiated at 12:28 PM.  Appropriate orders placed.  [name] was informed that the remainder of the evaluation will be completed by another provider, this initial triage assessment does not replace that evaluation, and the importance of remaining in the ED until their evaluation is complete.     Donna Hillier, PA-C 02/17/22 1230

## 2022-02-17 NOTE — Discharge Instructions (Addendum)
Take the medications to help with your headache and nausea.  Follow-up with your endocrinologist and neurosurgeon to discussed further treatment options of the pituitary tumor.

## 2022-02-17 NOTE — ED Provider Notes (Signed)
[facility] DEPT Provider Note   CSN: [id number] Arrival date & time: 02/17/22  1214     History  Chief Complaint  Patient presents with   Headache   Nausea    [name] is a 66 y.o. female.   Headache    Pt has history of pituitary tumor.  She has been taking two medications prednisone and cabergoline.  Pt states she has been having headache and nausea for the last few days.  SHe has been instructed to continue her medications.  Pt did vomit yesterday.  SHe had her medication adjusted and she felt like she was getting worse when the meds were decreased.  Her doctor told her to increase her meds again but it has not helped.  Pt is scheduled see surgery next Friday.  No vision changes.   Home Medications Prior to Admission medications   Medication Sig Start Date End Date Taking? Authorizing Provider  naproxen (NAPROSYN) 375 MG tablet Take 1 tablet (375 mg total) by mouth 2 (two) times daily. 02/17/22  Yes Dorie Rank, MD  ondansetron (ZOFRAN-ODT) 8 MG disintegrating tablet Take 1 tablet (8 mg total) by mouth every 8 (eight) hours as needed for nausea or vomiting. 02/17/22  Yes Dorie Rank, MD  atorvastatin (LIPITOR) 20 MG tablet TAKE 1 TABLET BY MOUTH EVERY DAY 12/02/18   Binnie Rail, MD  Calcium 500 MG tablet Take 1 tablet (500 mg total) by mouth daily. 12/09/15   Binnie Rail, MD  cholecalciferol (VITAMIN D) 1000 units tablet Take 1 tablet (1,000 Units total) by mouth daily. 12/09/15   Binnie Rail, MD  levothyroxine (SYNTHROID) 100 MCG tablet TAKE 1 TABLET BY MOUTH EVERY DAY 10/30/19   Binnie Rail, MD      Allergies    Patient has no known allergies.    Review of Systems   Review of Systems  Neurological:  Positive for headaches.    Physical Exam Updated Vital Signs BP (!) 147/77   Pulse (!) 48   Temp (!) 97.4 F (36.3 C) (Oral)   Resp 15   SpO2 99%  Physical Exam  ED Results / Procedures / Treatments   Labs (all labs  ordered are listed, but only abnormal results are displayed) Labs Reviewed  CBC WITH DIFFERENTIAL/PLATELET - Abnormal; Notable for the following components:      Result Value   Hemoglobin 16.0 (*)    HCT 46.9 (*)    All other components within normal limits  URINALYSIS, ROUTINE W REFLEX MICROSCOPIC - Abnormal; Notable for the following components:   Color, Urine STRAW (*)    Ketones, ur 5 (*)    All other components within normal limits  CBG MONITORING, ED - Abnormal; Notable for the following components:   Glucose-Capillary 100 (*)    All other components within normal limits  COMPREHENSIVE METABOLIC PANEL  LIPASE, BLOOD    EKG None  Radiology MR Brain W and Wo Contrast  Result Date: 02/17/2022 CLINICAL DATA:  Pituitary tumor, headache EXAM: MRI HEAD WITHOUT AND WITH CONTRAST TECHNIQUE: Multiplanar, multiecho pulse sequences of the brain and surrounding structures were obtained without and with intravenous contrast. CONTRAST:  6.52m GADAVIST GADOBUTROL 1 MMOL/ML IV SOLN COMPARISON:  08/29/2021 FINDINGS: Brain: Redemonstrated smoothly remodeled and expanded sella turcica with circumscribed predominantly cystic/necrotic mass with fluid fluid level and peripheral enhancement, extending from the sella into the suprasellar cistern. The mass measures 25 x 25 x 27 mm (AP x TR  x CC) (series 16, image 7 and series 15, image 7), previously 25 x 25 x 27 mm when remeasured similarly. The infundibulum remains deviated to the left inserting on the left lateral aspect of the mass. Again noted is extension to the right cavernous sinus (series 15, image 6). The cavernous ICAs are displaced laterally but appear patent. Unchanged mass effect on the inferior surface of the optic chiasm. No restricted diffusion to suggest acute or subacute infarct. No acute hemorrhage, parenchymal or meningeal mass, mass effect or midline shift. No hydrocephalus or extra-axial collection. The cervicomedullary junction is normal.  Vascular: Normal arterial flow voids. Normal arterial and venous enhancement. Skull and upper cervical spine: As described above remodeling of the sella and clivus. Normal marrow signal. Sinuses/Orbits: No acute finding. Other: The mastoids are well aerated. Redemonstrated small T2 hyperintense lesion in the left parotid gland, which appears unchanged and is favored to represent a small benign mixed tumor or cyst. IMPRESSION: 1. Redemonstrated intra sellar mass with suprasellar extension, compatible with a pituitary adenoma, with overall unchanged size, regional mass effect, right cavernous sinus extension, and remodeling of the sella. 2. No acute intracranial process. Electronically Signed   By: Merilyn Baba M.D.   On: 02/17/2022 19:20   CT Head Wo Contrast  Result Date: 02/17/2022 CLINICAL DATA:  Headache.  Possible metastases. EXAM: CT HEAD WITHOUT CONTRAST TECHNIQUE: Contiguous axial images were obtained from the base of the skull through the vertex without intravenous contrast. RADIATION DOSE REDUCTION: This exam was performed according to the departmental dose-optimization program which includes automated exposure control, adjustment of the mA and/or kV according to patient size and/or use of iterative reconstruction technique. COMPARISON:  MRI of August 30, 2021. FINDINGS: Brain: There is noted 2.6 x 2.3 cm predominantly cystic or necrotic intrasellar mass with suprasellar extension as noted on prior MRI. No hemorrhage or acute infarction is noted. Ventricular size is within normal limits. No midline shift is noted. Vascular: No hyperdense vessel or unexpected calcification. Skull: Normal. Negative for fracture or focal lesion. Sinuses/Orbits: No acute finding. Other: None. IMPRESSION: 2.6 x 2.3 cm predominantly cystic or necrotic intrasellar mass is noted with suprasellar extension as noted on prior MRI. This may simply represent pituitary adenoma. MRI may be performed for further evaluation or  follow-up. Electronically Signed   By: Marijo Conception M.D.   On: 02/17/2022 14:35    Procedures Procedures    Medications Ordered in ED Medications  sodium chloride 0.9 % bolus 1,000 mL (1,000 mLs Intravenous New Bag/Given 02/17/22 1723)    Followed by  0.9 %  sodium chloride infusion (has no administration in time range)  prochlorperazine (COMPAZINE) injection 10 mg (10 mg Intravenous Given 02/17/22 1724)  ketorolac (TORADOL) 15 MG/ML injection 15 mg (15 mg Intravenous Given 02/17/22 1724)  gadobutrol (GADAVIST) 1 MMOL/ML injection 6.5 mL (6.5 mLs Intravenous Contrast Given 02/17/22 1856)    ED Course/ Medical Decision Making/ A&P Clinical Course as of 02/17/22 2041  Fri Feb 17, 2022  2038 Urinalysis, Routine w reflex microscopic Urine, Clean Catch(!) Normal [JK]  2039 Comprehensive metabolic panel Normal [JK]  2039 CBC with Differential(!) [JK]  2039 MR Brain W and Wo Contrast CT scan and MRI showed stable pituitary tumor.  No acute changes [JK]    Clinical Course User Index [JK] Dorie Rank, MD                           Medical Decision  Making Problems Addressed: Nonintractable headache, unspecified chronicity pattern, unspecified headache type: acute illness or injury Pituitary tumor: chronic illness or injury with exacerbation, progression, or side effects of treatment  Amount and/or Complexity of Data Reviewed Labs: ordered. Decision-making details documented in ED Course. Radiology: ordered and independent interpretation performed.  Risk Prescription drug management.   Patient presented to the ED for evaluation of headache and nausea associated with known pituitary tumor.  Patient's ED evaluation overall reassuring.  No signs of severe dehydration.  No electrolyte abnormalities.  CT and MRI were performed and it shows stable changes associated with her known pituitary tumor.  No acute changes noted today compared to previous imaging test.  Patient was given medications  for headache and nausea with improvement.  Will discharge home with medications for symptomatic treatment.  Discussed close follow-up with her neurosurgeon and endocrinologist  Evaluation and diagnostic testing in the emergency department does not suggest an emergent condition requiring admission or immediate intervention beyond what has been performed at this time.  The patient is safe for discharge and has been instructed to return immediately for worsening symptoms, change in symptoms or any other concerns.         Final Clinical Impression(s) / ED Diagnoses Final diagnoses:  Pituitary tumor  Nonintractable headache, unspecified chronicity pattern, unspecified headache type    Rx / DC Orders ED Discharge Orders          Ordered    ondansetron (ZOFRAN-ODT) 8 MG disintegrating tablet  Every 8 hours PRN        02/17/22 2038    naproxen (NAPROSYN) 375 MG tablet  2 times daily        02/17/22 2038              Dorie Rank, MD 02/17/22 2041

## 2022-02-17 NOTE — ED Triage Notes (Signed)
Pt reports headache & nausea since 2 days ago. Pt reports hx of pituitary tumor & had hx of same s/s

## 2022-02-17 NOTE — ED Notes (Signed)
Awaiting call back from interpreting services for [employer] interpreter. Pt is non english speaking. Attempting to get [place] interpreter.

## 2022-02-20 DIAGNOSIS — E2740 Unspecified adrenocortical insufficiency: Secondary | ICD-10-CM | POA: Diagnosis not present

## 2022-02-24 DIAGNOSIS — D352 Benign neoplasm of pituitary gland: Secondary | ICD-10-CM | POA: Diagnosis not present

## 2022-04-03 DIAGNOSIS — R112 Nausea with vomiting, unspecified: Secondary | ICD-10-CM | POA: Diagnosis not present

## 2022-04-03 DIAGNOSIS — R42 Dizziness and giddiness: Secondary | ICD-10-CM | POA: Diagnosis not present

## 2022-04-03 DIAGNOSIS — K2940 Chronic atrophic gastritis without bleeding: Secondary | ICD-10-CM | POA: Diagnosis not present

## 2022-04-10 DIAGNOSIS — H401212 Low-tension glaucoma, right eye, moderate stage: Secondary | ICD-10-CM | POA: Diagnosis not present

## 2022-04-10 DIAGNOSIS — H401221 Low-tension glaucoma, left eye, mild stage: Secondary | ICD-10-CM | POA: Diagnosis not present

## 2022-04-13 DIAGNOSIS — D352 Benign neoplasm of pituitary gland: Secondary | ICD-10-CM | POA: Diagnosis not present

## 2022-04-21 ENCOUNTER — Emergency Department (HOSPITAL_COMMUNITY): Payer: Medicare Other

## 2022-04-21 ENCOUNTER — Other Ambulatory Visit: Payer: Self-pay

## 2022-04-21 ENCOUNTER — Ambulatory Visit (HOSPITAL_COMMUNITY)
Admission: EM | Admit: 2022-04-21 | Discharge: 2022-04-21 | Payer: Medicare Other | Attending: Family Medicine | Admitting: Family Medicine

## 2022-04-21 ENCOUNTER — Encounter (HOSPITAL_COMMUNITY): Payer: Self-pay

## 2022-04-21 ENCOUNTER — Observation Stay (HOSPITAL_COMMUNITY)
Admission: EM | Admit: 2022-04-21 | Discharge: 2022-04-22 | Disposition: A | Discharge status: Home / Self Care | Payer: Medicare Other | Attending: Internal Medicine | Admitting: Internal Medicine

## 2022-04-21 ENCOUNTER — Observation Stay (HOSPITAL_BASED_OUTPATIENT_CLINIC_OR_DEPARTMENT_OTHER): Payer: Medicare Other

## 2022-04-21 DIAGNOSIS — J9811 Atelectasis: Secondary | ICD-10-CM | POA: Diagnosis not present

## 2022-04-21 DIAGNOSIS — R778 Other specified abnormalities of plasma proteins: Secondary | ICD-10-CM | POA: Diagnosis not present

## 2022-04-21 DIAGNOSIS — E039 Hypothyroidism, unspecified: Secondary | ICD-10-CM | POA: Diagnosis not present

## 2022-04-21 DIAGNOSIS — D497 Neoplasm of unspecified behavior of endocrine glands and other parts of nervous system: Secondary | ICD-10-CM

## 2022-04-21 DIAGNOSIS — R072 Precordial pain: Secondary | ICD-10-CM | POA: Diagnosis not present

## 2022-04-21 DIAGNOSIS — R0602 Shortness of breath: Secondary | ICD-10-CM | POA: Diagnosis not present

## 2022-04-21 DIAGNOSIS — R110 Nausea: Secondary | ICD-10-CM

## 2022-04-21 DIAGNOSIS — I2510 Atherosclerotic heart disease of native coronary artery without angina pectoris: Secondary | ICD-10-CM | POA: Diagnosis present

## 2022-04-21 DIAGNOSIS — I4891 Unspecified atrial fibrillation: Principal | ICD-10-CM

## 2022-04-21 DIAGNOSIS — R61 Generalized hyperhidrosis: Secondary | ICD-10-CM

## 2022-04-21 DIAGNOSIS — K589 Irritable bowel syndrome without diarrhea: Secondary | ICD-10-CM | POA: Diagnosis present

## 2022-04-21 DIAGNOSIS — R079 Chest pain, unspecified: Secondary | ICD-10-CM

## 2022-04-21 DIAGNOSIS — Z79899 Other long term (current) drug therapy: Secondary | ICD-10-CM | POA: Diagnosis not present

## 2022-04-21 DIAGNOSIS — E785 Hyperlipidemia, unspecified: Secondary | ICD-10-CM | POA: Diagnosis present

## 2022-04-21 LAB — CBC WITH DIFFERENTIAL/PLATELET
Abs Immature Granulocytes: 0.05 10*3/uL (ref 0.00–0.07)
Basophils Absolute: 0.1 10*3/uL (ref 0.0–0.1)
Basophils Relative: 1 %
Eosinophils Absolute: 0 10*3/uL (ref 0.0–0.5)
Eosinophils Relative: 0 %
HCT: 44.9 % (ref 36.0–46.0)
Hemoglobin: 14.9 g/dL (ref 12.0–15.0)
Immature Granulocytes: 1 %
Lymphocytes Relative: 17 %
Lymphs Abs: 1.7 10*3/uL (ref 0.7–4.0)
MCH: 31.2 pg (ref 26.0–34.0)
MCHC: 33.2 g/dL (ref 30.0–36.0)
MCV: 93.9 fL (ref 80.0–100.0)
Monocytes Absolute: 0.4 10*3/uL (ref 0.1–1.0)
Monocytes Relative: 4 %
Neutro Abs: 8 10*3/uL — ABNORMAL HIGH (ref 1.7–7.7)
Neutrophils Relative %: 77 %
Platelets: 231 10*3/uL (ref 150–400)
RBC: 4.78 MIL/uL (ref 3.87–5.11)
RDW: 13.2 % (ref 11.5–15.5)
WBC: 10.2 10*3/uL (ref 4.0–10.5)
nRBC: 0 % (ref 0.0–0.2)

## 2022-04-21 LAB — COMPREHENSIVE METABOLIC PANEL
ALT: 20 U/L (ref 0–44)
AST: 28 U/L (ref 15–41)
Albumin: 3.8 g/dL (ref 3.5–5.0)
Alkaline Phosphatase: 52 U/L (ref 38–126)
Anion gap: 7 (ref 5–15)
BUN: 23 mg/dL (ref 8–23)
CO2: 21 mmol/L — ABNORMAL LOW (ref 22–32)
Calcium: 8.8 mg/dL — ABNORMAL LOW (ref 8.9–10.3)
Chloride: 112 mmol/L — ABNORMAL HIGH (ref 98–111)
Creatinine, Ser: 0.71 mg/dL (ref 0.44–1.00)
GFR, Estimated: >60 mL/min (ref >60)
Glucose, Bld: 109 mg/dL — ABNORMAL HIGH (ref 70–99)
Potassium: 4.4 mmol/L (ref 3.5–5.1)
Sodium: 140 mmol/L (ref 135–145)
Total Bilirubin: 0.7 mg/dL (ref 0.3–1.2)
Total Protein: 6.2 g/dL — ABNORMAL LOW (ref 6.5–8.1)

## 2022-04-21 LAB — TROPONIN I (HIGH SENSITIVITY)
Troponin I (High Sensitivity): 20 ng/L — ABNORMAL HIGH (ref <18)
Troponin I (High Sensitivity): 18 ng/L — ABNORMAL HIGH (ref <18)

## 2022-04-21 LAB — ECHOCARDIOGRAM COMPLETE
AR max vel: 3.45 cm2
AV Area VTI: 3.27 cm2
AV Area mean vel: 3.45 cm2
AV Mean grad: 2 mmHg
AV Peak grad: 3.2 mmHg
Ao pk vel: 0.89 m/s
Height: 64 in
P 1/2 time: 243 msec
S' Lateral: 2.4 cm
Weight: 2032 oz

## 2022-04-21 LAB — LIPASE, BLOOD: Lipase: 53 U/L — ABNORMAL HIGH (ref 11–51)

## 2022-04-21 LAB — HIV ANTIBODY (ROUTINE TESTING W REFLEX): HIV Screen 4th Generation wRfx: NONREACTIVE

## 2022-04-21 LAB — BRAIN NATRIURETIC PEPTIDE: B Natriuretic Peptide: 475.7 pg/mL — ABNORMAL HIGH (ref 0.0–100.0)

## 2022-04-21 LAB — MAGNESIUM: Magnesium: 2.4 mg/dL (ref 1.7–2.4)

## 2022-04-21 LAB — TSH: TSH: 0.01 u[IU]/mL — ABNORMAL LOW (ref 0.350–4.500)

## 2022-04-21 MED ORDER — METOPROLOL TARTRATE 12.5 MG HALF TABLET: 12.5000 mg | ORAL_TABLET | Freq: Two times a day (BID) | ORAL | Status: DC

## 2022-04-21 MED ORDER — METOPROLOL TARTRATE 12.5 MG HALF TABLET
12.5000 mg | ORAL_TABLET | Freq: Once | ORAL | Status: AC
  Administered 2022-04-21: 12.5 mg via ORAL
  Filled 2022-04-21: qty 1

## 2022-04-21 MED ORDER — METOPROLOL TARTRATE 12.5 MG HALF TABLET: 12.5000 mg | ORAL_TABLET | Freq: Once | ORAL | Status: DC | PRN

## 2022-04-21 MED ORDER — ONDANSETRON HCL 4 MG/2ML IJ SOLN
4.0000 mg | Freq: Once | INTRAMUSCULAR | Status: AC
  Administered 2022-04-21: 4 mg via INTRAVENOUS

## 2022-04-21 MED ORDER — DILTIAZEM HCL-DEXTROSE 125-5 MG/125ML-% IV SOLN (PREMIX)
5.0000 mg/h | INTRAVENOUS | Status: DC
  Administered 2022-04-21: 5 mg/h via INTRAVENOUS
  Filled 2022-04-21: qty 125

## 2022-04-21 MED ORDER — SODIUM CHLORIDE 0.9 % IV SOLN: INTRAVENOUS | Status: DC

## 2022-04-21 MED ORDER — SODIUM CHLORIDE 0.9 % IV SOLN: 250.0000 mL | INTRAVENOUS | Status: DC | PRN

## 2022-04-21 MED ORDER — METOPROLOL TARTRATE 25 MG PO TABS: 25.0000 mg | ORAL_TABLET | Freq: Two times a day (BID) | ORAL | Status: DC

## 2022-04-21 MED ORDER — ACETAMINOPHEN 650 MG RE SUPP: 650.0000 mg | Freq: Four times a day (QID) | RECTAL | Status: DC | PRN

## 2022-04-21 MED ORDER — ACETAMINOPHEN 325 MG PO TABS: 650.0000 mg | ORAL_TABLET | Freq: Four times a day (QID) | ORAL | Status: DC | PRN

## 2022-04-21 MED ORDER — SODIUM CHLORIDE 0.9% FLUSH: 3.0000 mL | INTRAVENOUS | Status: DC | PRN

## 2022-04-21 MED ORDER — ONDANSETRON HCL 4 MG/2ML IJ SOLN
INTRAMUSCULAR | Status: AC
  Filled 2022-04-21: qty 2

## 2022-04-21 MED ORDER — METOPROLOL TARTRATE 25 MG PO TABS
25.0000 mg | ORAL_TABLET | Freq: Two times a day (BID) | ORAL | Status: DC
  Filled 2022-04-21: qty 1

## 2022-04-21 MED ORDER — SODIUM CHLORIDE 0.9% FLUSH: 3.0000 mL | Freq: Two times a day (BID) | INTRAVENOUS | Status: DC

## 2022-04-21 MED ORDER — PREDNISONE 10 MG PO TABS
10.0000 mg | ORAL_TABLET | Freq: Every day | ORAL | Status: DC
  Administered 2022-04-22: 10 mg via ORAL
  Filled 2022-04-21: qty 1

## 2022-04-21 MED ORDER — SODIUM CHLORIDE 0.9 % IV BOLUS
500.0000 mL | Freq: Once | INTRAVENOUS | Status: AC
  Administered 2022-04-21: 500 mL via INTRAVENOUS

## 2022-04-21 MED ORDER — APIXABAN 5 MG PO TABS
5.0000 mg | ORAL_TABLET | Freq: Two times a day (BID) | ORAL | Status: DC
  Administered 2022-04-21 – 2022-04-22 (×2): 5 mg via ORAL
  Filled 2022-04-21 (×2): qty 1

## 2022-04-21 MED ORDER — LATANOPROST 0.005 % OP SOLN
1.0000 [drp] | Freq: Every day | OPHTHALMIC | Status: DC
  Administered 2022-04-21: 1 [drp] via OPHTHALMIC
  Filled 2022-04-21: qty 2.5

## 2022-04-21 MED ORDER — SODIUM CHLORIDE 0.9 % IV BOLUS
1000.0000 mL | Freq: Once | INTRAVENOUS | Status: AC
  Administered 2022-04-21: 1000 mL via INTRAVENOUS

## 2022-04-21 MED ORDER — LEVOTHYROXINE SODIUM 100 MCG PO TABS
100.0000 ug | ORAL_TABLET | Freq: Every day | ORAL | Status: DC
  Administered 2022-04-22: 100 ug via ORAL
  Filled 2022-04-21: qty 1

## 2022-04-21 NOTE — Progress Notes (Signed)
Reviewed medicaiton list, patient denies taking atorvastatin, takes omeprazole prn. Reviewed need to stop taking naprosyn because of starting eliquis with patients son.

## 2022-04-21 NOTE — Assessment & Plan Note (Signed)
S/p thyroidectomy for Hurthle cell changes. TSH goal <0.3 TSH pending in setting of new afib with RVR Continue home synthroid

## 2022-04-21 NOTE — Assessment & Plan Note (Addendum)
Followed by neurosurgery with [facility] (Dr. Leonel Ramsay) They are just following and watching for now Will have appointment with endocrinology coming up She is on chronic prednisone-'10mg'$  daily

## 2022-04-21 NOTE — Assessment & Plan Note (Addendum)
Coronary calcium test done in [place] in 04/2017: calcium score of 14.53, plaque in LAD and pRCA of 30% She c/o of chest pain, but this has resolved, initial troponin 20. ekg with no signs of ST elevation/changes. Not consistent with ACS  Continue to trend troponin Check echo Suspect elevated troponin due to demand ischemia in setting of afib with RVR

## 2022-04-21 NOTE — ED Notes (Signed)
Called [name] and spoke with charge nurse Gabriel Cirri. Informed of Patient being transferred via [facility] and  Patient's current symptoms.

## 2022-04-21 NOTE — Assessment & Plan Note (Addendum)
No longer on statin

## 2022-04-21 NOTE — ED Provider Notes (Signed)
Patient presents to urgent care for evaluation of chest pain to the middle chest, shortness of breath, and heart palpitations that started 2 days ago. Chest pain comes and goes. Also reports nausea and diaphoresis that started this morning. No history heart problems reported by patient, although coronary artery disease is listed in her problem list. No history of heart attack or stent placement. She does not currently take blood thinners or any medications related to her heart. Takes medication for hypothyroidism but denies recent dosage changes to this. She has never had symptoms like these before.  Chest pain is currently a 5 on a scale of 0-10.  She has not attempted use of any over-the-counter medications prior to arrival urgent care for symptoms and denies history of chronic respiratory problems.  No recent URI symptoms.  She appears diaphoretic and pale to inspection. EKG shows heart rate at 110 without red flag ST changes, although she remains hooked up to the EKG monitor and is currently in atrial fibrillation with rapid ventricular response. Heart rate fluctuating from 110s to 160s. She appears to be very symptomatic and tachypneic.  Oxygen saturation 100% on room air but 2 L O2 placed on patient for comfort.  Blood pressure soft, but stable.  4 mg Zofran given via IV due to nausea.  Discussed physical exam and EKG findings with patient and brother.  Recommend transfer for to the nearest emergency department via [place] as she is very symptomatic and in atrial fibrillation with rapid ventricular response.  She has never been in this rhythm before.  No neurologic changes to brief neurologic exam.  IV placed to urgent care and heart rate continuously monitored using EKG machine.  Patient discharged from urgent care in stable condition with [facility].  Brother used as Optometrist during entire visit.  Offered medical interpreter and patient declined.     Talbot Grumbling, [place] 04/21/22 1535

## 2022-04-21 NOTE — Assessment & Plan Note (Signed)
66 year old female presenting with acute onset of chest pain correlating with her watch showing fast heart rate found to be in new atrial fibrillation with RVR -obs to progressive -initially started on cardizem drip, but on 73mg and rate 80-90s, change over to metoprolol '25mg'$  BID -pressures a little soft, but typically normal per family at 100-110 on cardizem gtt -check echo -CHA2Ds2-vasc score of 2. Discussed with patient family anticoagulation. Risks and benefits discussed. Discussed risk of stroke and recommended guidelines. Okay with starting eliquis. SW consult for medication assistance to make sure affordable.  -check TSH/Mag -continue to trend troponin

## 2022-04-21 NOTE — ED Triage Notes (Signed)
Patient is being discharged from the Urgent Care and sent to the Emergency Department via [facility] . Per Gwenette Greet NP, patient is in need of higher level of care due to Chest Pain, SOB, and EKG changes without history of AFIB. Patient is aware and verbalizes understanding of plan of care.  Vitals:   04/21/22 1300 04/21/22 1301  BP: 104/78   Pulse: 75 (!) 143  Resp: (!) 24   SpO2: 99%

## 2022-04-21 NOTE — H&P (Addendum)
History and Physical    [name] DOB: 02-18-1956 DOA: 04/21/2022 DOS: the patient was seen and examined on 04/21/2022 PCP: Janie Morning, DO  Patient coming from: Home - lives with her husband.  Son at bedside and translates for patient. Offered to bring in translation service and they declined.   Chief Complaint: chest pain   HPI: [name] is a 66 y.o. female with medical history significant of  IBS,  hypothyroidism, HLD, CAD, enlarged pituitary sella who presented to ED with complaints of chest pain. She was seen at urgent care and found to be in afib with RVR and sent to ED.  Her chest started to hurt early this AM and was intermittent in nature. Her watch also alerted her as well. Pain substernal, no radiation. Described as squeezing. Pain was severe enough she couldn't sleep. She had associated nausea and shortness of breath. She has no chest pain at this time. She has not had palpitations.   She does not drink any caffeine or energy drinks. Some stress at home. No recent illness. History of heart disease in her mother.    He has been feeling good. Denies any fever/chills, palpitations, shortness of breath or cough, abdominal pain, N/V/D, dysuria or leg swelling.   She does not smoke or drink.   ER Course:  vitals: bp: 104/78, HR: 75-143, RR: 24, oxygen: 99%RA Pertinent labs: troponin 20 CXR: mild bibasilar atelectasis  In ED: given 1L bolus sand started on cardizem drip.    Review of Systems: As mentioned in the history of present illness. All other systems reviewed and are negative. 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 She has never used smokeless tobacco. She reports that she does not drink alcohol and does not use drugs.  No Known Allergies  Family History  Problem Relation Age of Onset   Heart disease Neg Hx    Stroke Neg Hx    Diabetes Neg Hx     Prior to Admission medications   Medication Sig Start Date End Date Taking? Authorizing Provider  atorvastatin (LIPITOR) 20 MG tablet TAKE 1 TABLET BY MOUTH EVERY DAY 12/02/18   Binnie Rail, MD  Calcium 500 MG tablet Take 1 tablet (500 mg total) by mouth daily. 12/09/15   Binnie Rail, MD  cholecalciferol (VITAMIN D) 1000 units tablet Take 1 tablet (1,000 Units total) by mouth daily. 12/09/15   Binnie Rail, MD  levothyroxine (SYNTHROID) 100 MCG tablet TAKE 1 TABLET BY MOUTH EVERY DAY 10/30/19   Binnie Rail, MD  naproxen (NAPROSYN) 375 MG tablet Take 1 tablet (375 mg total) by mouth 2 (two) times daily. 02/17/22   Dorie Rank, MD  ondansetron (ZOFRAN-ODT) 8 MG disintegrating tablet Take 1 tablet (8 mg total) by mouth every 8 (eight) hours as needed for nausea or vomiting. 02/17/22   Dorie Rank, MD  predniSONE Donley Redder)  5 MG tablet Take 5 mg by mouth daily. 03/06/22   [provider]    Physical Exam: Vitals:   04/21/22 1402 04/21/22 1800  BP:  98/73  Pulse:  72  Resp:  (!) 21  SpO2:  100%  Weight: 57.6 kg   Height: '5\' 4"'$  (1.626 m)    General:  Appears calm and comfortable and is in NAD Eyes:  PERRL, EOMI, normal lids, iris ENT:  grossly normal hearing, lips & tongue, mmm; appropriate dentition Neck:  no LAD, masses or thyromegaly; no carotid bruits, no JVD Cardiovascular:  Regularly, irregular no m/r/g. No LE edema.  Respiratory:   CTA bilaterally with no wheezes/rales/rhonchi.  Normal respiratory effort. Abdomen:  soft, NT, ND, NABS Back:   normal alignment, no  CVAT Skin:  no rash or induration seen on limited exam Musculoskeletal:  grossly normal tone BUE/BLE, good ROM, no bony abnormality Lower extremity:  No LE edema.  Limited foot exam with no ulcerations.  2+ distal pulses. Psychiatric:  grossly normal mood and affect, speech fluent and appropriate, AOx3 Neurologic:  CN 2-12 grossly intact, moves all extremities in coordinated fashion, sensation intact   Radiological Exams on Admission: Independently reviewed - see discussion in A/P where applicable  ECHOCARDIOGRAM COMPLETE  Result Date: 04/21/2022    ECHOCARDIOGRAM REPORT   [name] Date of Exam: 04/21/2022 Medical Rec #:  [id number]       Height:       64.0 in Accession #:    [id number]      Weight:       127.0 lb Date of Birth:  June 25, 1956       BSA:          1.613 m Patient Age:    28 years        BP:           104/78 mmHg Patient Gender: F               HR:           89 bpm. Exam Location:  Inpatient Procedure: 2D Echo, Cardiac Doppler and Color Doppler Indications:    Atrial fibrillation  History:        Patient has no prior history of Echocardiogram examinations.                 CAD, Arrythmias:Atrial Fibrillation, Signs/Symptoms:Chest Pain                 and Shortness of Breath; Risk Factors:Dyslipidemia.  Sonographer:    Clayton Lefort RDCS (AE) Referring Phys: [id number] [facility]  Sonographer Comments: Suboptimal apical window and suboptimal subcostal window. IMPRESSIONS  1. Left ventricular ejection fraction, by estimation, is 60 to 65%. The left ventricle has normal function. The left ventricle has no regional wall motion abnormalities. Left ventricular diastolic parameters are indeterminate.  2. Right ventricular systolic function is normal. The right ventricular size is normal.  3. The mitral valve is normal in structure. No evidence of mitral valve regurgitation. No evidence of mitral stenosis.  4. The aortic valve was not well visualized. There is moderate calcification  of the aortic valve. There is moderate thickening of the aortic valve. Aortic valve regurgitation is trivial. Aortic valve sclerosis/calcification is present, without any evidence of aortic stenosis.  5. The inferior vena cava is normal in size with greater than 50% respiratory variability, suggesting right atrial pressure of 3 mmHg. FINDINGS  Left Ventricle: Left ventricular  ejection fraction, by estimation, is 60 to 65%. The left ventricle has normal function. The left ventricle has no regional wall motion abnormalities. The left ventricular internal cavity size was normal in size. There is  no left ventricular hypertrophy. Left ventricular diastolic parameters are indeterminate. Right Ventricle: The right ventricular size is normal. No increase in right ventricular wall thickness. Right ventricular systolic function is normal. Left Atrium: Left atrial size was normal in size. Right Atrium: Right atrial size was normal in size. Pericardium: There is no evidence of pericardial effusion. Mitral Valve: The mitral valve is normal in structure. No evidence of mitral valve regurgitation. No evidence of mitral valve stenosis. Tricuspid Valve: The tricuspid valve is normal in structure. Tricuspid valve regurgitation is not demonstrated. No evidence of tricuspid stenosis. Aortic Valve: The aortic valve was not well visualized. There is moderate calcification of the aortic valve. There is moderate thickening of the aortic valve. Aortic valve regurgitation is trivial. Aortic regurgitation PHT measures 243 msec. Aortic valve  sclerosis/calcification is present, without any evidence of aortic stenosis. Aortic valve mean gradient measures 2.0 mmHg. Aortic valve peak gradient measures 3.2 mmHg. Aortic valve area, by VTI measures 3.27 cm. Pulmonic Valve: The pulmonic valve was normal in structure. Pulmonic valve regurgitation is not visualized. No evidence of pulmonic stenosis. Aorta: The aortic root is normal in size and  structure. Venous: The inferior vena cava is normal in size with greater than 50% respiratory variability, suggesting right atrial pressure of 3 mmHg. IAS/Shunts: No atrial level shunt detected by color flow Doppler.  LEFT VENTRICLE PLAX 2D LVIDd:         3.30 cm LVIDs:         2.40 cm LV PW:         1.10 cm LV IVS:        1.00 cm LVOT diam:     2.10 cm LV SV:         54 LV SV Index:   33 LVOT Area:     3.46 cm  RIGHT VENTRICLE RV Basal diam:  2.30 cm RV S prime:     10.40 cm/s TAPSE (M-mode): 1.8 cm LEFT ATRIUM             Index        RIGHT ATRIUM           Index LA diam:        3.70 cm 2.29 cm/m   RA Area:     10.60 cm LA Vol (A2C):   33.6 ml 20.83 ml/m  RA Volume:   21.30 ml  13.21 ml/m LA Vol (A4C):   33.9 ml 21.02 ml/m LA Biplane Vol: 34.1 ml 21.14 ml/m  AORTIC VALVE AV Area (Vmax):    3.45 cm AV Area (Vmean):   3.45 cm AV Area (VTI):     3.27 cm AV Vmax:           89.00 cm/s AV Vmean:          57.300 cm/s AV VTI:            0.165 m AV Peak Grad:      3.2 mmHg AV Mean Grad:      2.0 mmHg LVOT Vmax:         88.60 cm/s LVOT Vmean:        57.100 cm/s LVOT VTI:          0.156 m LVOT/AV VTI ratio: 0.95 AI PHT:  243 msec  AORTA Ao Root diam: 3.20 cm  SHUNTS Systemic VTI:  0.16 m Systemic Diam: 2.10 cm Jenkins Rouge MD Electronically signed by Jenkins Rouge MD Signature Date/Time: 04/21/2022/5:34:05 PM    Final    DG Chest Port 1 View  Result Date: 04/21/2022 CLINICAL DATA:  Chest pain. EXAM: PORTABLE CHEST 1 VIEW COMPARISON:  CT chest 01/31/2018. FINDINGS: Trachea is midline. Thyroidectomy. Heart size is accentuated by AP semi upright technique. Mild bibasilar streaky atelectasis. No airspace consolidation or pleural fluid. IMPRESSION: Mild bibasilar subsegmental atelectasis. Electronically Signed   By: Lorin Picket M.D.   On: 04/21/2022 15:43    EKG: Independently reviewed.  Atrial fibrillation with rate 110; nonspecific ST changes with no evidence of acute ischemia   Labs on Admission:  I have personally reviewed the available labs and imaging studies at the time of the admission.  Pertinent labs:   Troponin 20>pending   Assessment and Plan: Principal Problem:   Atrial fibrillation with RVR (HCC) Active Problems:   CAD (coronary artery disease)   Hypothyroidism   Pituitary tumor   Hyperlipidemia    Assessment and Plan: * Atrial fibrillation with RVR ([place]) 66 year old female presenting with acute onset of chest pain correlating with her watch showing fast heart rate found to be in new atrial fibrillation with RVR -obs to progressive -initially started on cardizem drip, but on 19mg and rate 80-90s, change over to metoprolol '25mg'$  BID -pressures a little soft, but typically normal per family at 100-110 on cardizem gtt -check echo -CHA2Ds2-vasc score of 2. Discussed with patient family anticoagulation. Risks and benefits discussed. Discussed risk of stroke and recommended guidelines. Okay with starting eliquis. SW consult for medication assistance to make sure affordable.  -check TSH/Mag -continue to trend troponin  CAD (coronary artery disease) Coronary calcium [place] in [place]: calcium score of 14.53, plaque in LAD and pRCA of 30% She c/o of chest pain, but this has resolved, initial troponin 20. ekg with no signs of ST elevation/changes. 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 TSH goal <0.3 TSH pending in setting of new afib with RVR Continue home synthroid  Pituitary tumor Followed by neurosurgery with [facility] (Dr. KLeonel Ramsay They are just following and watching for now Will have appointment with endocrinology coming up She is on chronic prednisone-'10mg'$  daily   Hyperlipidemia No longer on statin     Advance Care Planning:   Code Status: Full Code   Consults: SW  DVT  Prophylaxis: eliquis  Family Communication: son at bedside   Severity of Illness: The appropriate patient status for this patient is OBSERVATION. Observation status is judged to be reasonable and necessary in order to provide the required intensity of service to ensure the patient's safety. The patient's presenting symptoms, physical exam findings, and initial radiographic and laboratory data in the context of their medical condition is felt to place them at decreased risk for further clinical deterioration. Furthermore, it is anticipated that the patient will be medically stable for discharge from the hospital within 2 midnights of admission.   Author: AOrma Flaming MD 04/21/2022 8:08 PM  For on call review [email or web address]

## 2022-04-21 NOTE — ED Triage Notes (Signed)
Patient having central chest pain, no radiation, and SOB this morning. No medical history of heart or Lung problems.   Patient has a pituitary tumor since last year.

## 2022-04-21 NOTE — Discharge Instructions (Addendum)

## 2022-04-21 NOTE — ED Triage Notes (Signed)
Patient went to urgent care for chest pain, after an EKG they called 911 for new onset of Afib with RVR.

## 2022-04-21 NOTE — ED Provider Notes (Signed)
[facility] EMERGENCY DEPARTMENT Provider Note   CSN: [id number] Arrival date & time: 04/21/22  1341     History  Chief Complaint  Patient presents with   Chest Pain    [name] is a 66 y.o. female.  Patient went to urgent care today.  Patient's primary care doctor is Dr. Theda Sers with [facility].  Patient had some chest pain today chest pain onset was about 2 in the morning was substernal.  Nonradiating associated with nausea but no shortness of breath no vomiting.  It is since resolved.  Also at urgent care they noted that patient was in atrial fib with RVR no past history of that.  Patient's Apple Watch told her that she had developed a fast heart rate yesterday.  Past medical history significant for multinodular goiter pituitary abnormality irritable bowel syndrome.  Past surgical history significant for thyroidectomy in 2002 appendectomy colonoscopy with polypectomy in 2010 done by [facility].  Patient is a nontobacco user.  And patient has no history of any known cardiac problems.  Patient is [place] speaks some [place].  Family member with her speaks excellent English and excellent interpreter.       Home Medications Prior to Admission medications   Medication Sig Start Date End Date Taking? Authorizing Provider  atorvastatin (LIPITOR) 20 MG tablet TAKE 1 TABLET BY MOUTH EVERY DAY 12/02/18   Binnie Rail, MD  Calcium 500 MG tablet Take 1 tablet (500 mg total) by mouth daily. 12/09/15   Binnie Rail, MD  cholecalciferol (VITAMIN D) 1000 units tablet Take 1 tablet (1,000 Units total) by mouth daily. 12/09/15   Binnie Rail, MD  levothyroxine (SYNTHROID) 100 MCG tablet TAKE 1 TABLET BY MOUTH EVERY DAY 10/30/19   Binnie Rail, MD  naproxen (NAPROSYN) 375 MG tablet Take 1 tablet (375 mg total) by mouth 2 (two) times daily. 02/17/22   Dorie Rank, MD  ondansetron (ZOFRAN-ODT) 8 MG disintegrating tablet Take 1 tablet (8 mg total) by mouth every 8  (eight) hours as needed for nausea or vomiting. 02/17/22   Dorie Rank, MD  predniSONE (DELTASONE) 5 MG tablet Take 5 mg by mouth daily. 03/06/22   [provider]      Allergies    Patient has no allergy information on record.    Review of Systems   Review of Systems  Constitutional:  Negative for chills and fever.  HENT:  Negative for ear pain and sore throat.   Eyes:  Negative for pain and visual disturbance.  Respiratory:  Negative for cough and shortness of breath.   Cardiovascular:  Positive for chest pain and palpitations.  Gastrointestinal:  Positive for nausea. Negative for abdominal pain and vomiting.  Genitourinary:  Negative for dysuria and hematuria.  Musculoskeletal:  Negative for arthralgias and back pain.  Skin:  Negative for color change and rash.  Neurological:  Negative for seizures and syncope.  All other systems reviewed and are negative.   Physical Exam Updated Vital Signs Ht 1.626 m ('5\' 4"'$ )   Wt 57.6 kg   BMI 21.80 kg/m  Physical Exam Vitals and nursing note reviewed.  Constitutional:      General: She is not in acute distress.    Appearance: She is well-developed.  HENT:     Head: Normocephalic and atraumatic.  Eyes:     Conjunctiva/sclera: Conjunctivae normal.  Cardiovascular:     Rate and Rhythm: Tachycardia present. Rhythm irregular.     Pulses:  Radial pulses are 2+ on the right side and 2+ on the left side.     Heart sounds: No murmur heard. Pulmonary:     Effort: Pulmonary effort is normal. No respiratory distress.     Breath sounds: Normal breath sounds. No decreased breath sounds, wheezing, rhonchi or rales.  Chest:     Chest wall: No tenderness.  Abdominal:     Palpations: Abdomen is soft.     Tenderness: There is no abdominal tenderness.  Musculoskeletal:        General: No swelling.     Cervical back: Neck supple.     Right lower leg: No edema.     Left lower leg: No edema.  Skin:    General: Skin is warm and  dry.     Capillary Refill: Capillary refill takes less than 2 seconds.  Neurological:     General: No focal deficit present.     Mental Status: She is alert and oriented to person, place, and time.  Psychiatric:        Mood and Affect: Mood normal.     ED Results / Procedures / Treatments   Labs (all labs ordered are listed, but only abnormal results are displayed) Labs Reviewed  CBC WITH DIFFERENTIAL/PLATELET - Abnormal; Notable for the following components:      Result Value   Neutro Abs 8.0 (*)    All other components within normal limits  COMPREHENSIVE METABOLIC PANEL - Abnormal; Notable for the following components:   Chloride 112 (*)    CO2 21 (*)    Glucose, Bld 109 (*)    Calcium 8.8 (*)    Total Protein 6.2 (*)    All other components within normal limits  LIPASE, BLOOD - Abnormal; Notable for the following components:   Lipase 53 (*)    All other components within normal limits  TROPONIN I (HIGH SENSITIVITY) - Abnormal; Notable for the following components:   Troponin I (High Sensitivity) 20 (*)    All other components within normal limits    EKG EKG Interpretation  Date/Time:  Friday April 21 2022 14:00:22 EDT Ventricular Rate:  128 PR Interval:    QRS Duration: 70 QT Interval:  304 QTC Calculation: 444 R Axis:   99 Text Interpretation: Atrial fibrillation Right axis deviation Low voltage, extremity leads Probable lateral infarct, age indeterminate Confirmed by Fredia Sorrow [id number]) on 04/21/2022 2:13:31 PM  Radiology DG Chest Port 1 View  Result Date: 04/21/2022 CLINICAL DATA:  Chest pain. EXAM: PORTABLE CHEST 1 VIEW COMPARISON:  CT chest 01/31/2018. FINDINGS: Trachea is midline. Thyroidectomy. Heart size is accentuated by AP semi upright technique. Mild bibasilar streaky atelectasis. No airspace consolidation or pleural fluid. IMPRESSION: Mild bibasilar subsegmental atelectasis. Electronically Signed   By: Lorin Picket M.D.   On: 04/21/2022 15:43     Procedures Procedures    Medications Ordered in ED Medications  0.9 %  sodium chloride infusion (has no administration in time range)  diltiazem (CARDIZEM) 125 mg in dextrose 5% 125 mL (1 mg/mL) infusion (5 mg/hr Intravenous New Bag/Given 04/21/22 1459)  sodium chloride 0.9 % bolus 1,000 mL (1,000 mLs Intravenous New Bag/Given 04/21/22 1502)    ED Course/ Medical Decision Making/ A&P                           Medical Decision Making Amount and/or Complexity of Data Reviewed Labs: ordered. Radiology: ordered.  Risk Prescription drug management. Decision regarding  hospitalization.   CRITICAL CARE Performed by: Fredia Sorrow Total critical care time: 40 minutes Critical care time was exclusive of separately billable procedures and treating other patients. Critical care was necessary to treat or prevent imminent or life-threatening deterioration. Critical care was time spent personally by me on the following activities: development of treatment plan with patient and/or surrogate as well as nursing, discussions with consultants, evaluation of patient's response to treatment, examination of patient, obtaining history from patient or surrogate, ordering and performing treatments and interventions, ordering and review of laboratory studies, ordering and review of radiographic studies, pulse oximetry and re-evaluation of patient's condition.  Patient here with atrial fibrillation with RVR.  Heart rate anywhere from 140 down to the low 100s.  Patient's blood pressure a little soft on arrival.  The patient is of small build.  Patient given IV fluids and started on diltiazem drip.  Patient not currently on blood thinners no prior history of atrial fibrillation.  Patient thinks it started yesterday based on her Apple Watch.  Patient's chest pain that she had that started about 2 in the morning has resolved.  Diltiazem drip is helping patient's rate rate now more in the 100-1 20 range is  showing improvement in blood pressure systolic is 163.  CBC white count 10.2 hemoglobin 84.6 complete metabolic panel calcium down a little bit at 8.0 potassium good at 4.4 patient's renal function is normal GFR is greater than 60.  Liver function test are normal.  Lipase is elevated a little bit at 53 but no epigastric abdominal pain.  Initial troponin 20 will need delta troponin.  Chest x-ray mild bibasilar segmental atelectasis.  We will contact hospitalist for admission.  Final Clinical Impression(s) / ED Diagnoses Final diagnoses:  Precordial pain  Atrial fibrillation with RVR [facility])    Rx / DC Orders ED Discharge Orders     None         Fredia Sorrow, MD 04/21/22 1554

## 2022-04-21 NOTE — Progress Notes (Signed)
  Echocardiogram 2D Echocardiogram has been performed.  [name] 04/21/2022, 5:29 PM

## 2022-04-22 DIAGNOSIS — I4891 Unspecified atrial fibrillation: Secondary | ICD-10-CM | POA: Diagnosis not present

## 2022-04-22 LAB — BASIC METABOLIC PANEL
Anion gap: 8 (ref 5–15)
BUN: 16 mg/dL (ref 8–23)
CO2: 24 mmol/L (ref 22–32)
Calcium: 7.8 mg/dL — ABNORMAL LOW (ref 8.9–10.3)
Chloride: 112 mmol/L — ABNORMAL HIGH (ref 98–111)
Creatinine, Ser: 0.75 mg/dL (ref 0.44–1.00)
GFR, Estimated: >60 mL/min (ref >60)
Glucose, Bld: 88 mg/dL (ref 70–99)
Potassium: 3.8 mmol/L (ref 3.5–5.1)
Sodium: 144 mmol/L (ref 135–145)

## 2022-04-22 LAB — CBC
HCT: 39.8 % (ref 36.0–46.0)
Hemoglobin: 13.5 g/dL (ref 12.0–15.0)
MCH: 31.8 pg (ref 26.0–34.0)
MCHC: 33.9 g/dL (ref 30.0–36.0)
MCV: 93.9 fL (ref 80.0–100.0)
Platelets: 190 10*3/uL (ref 150–400)
RBC: 4.24 MIL/uL (ref 3.87–5.11)
RDW: 13.3 % (ref 11.5–15.5)
WBC: 8.9 10*3/uL (ref 4.0–10.5)
nRBC: 0 % (ref 0.0–0.2)

## 2022-04-22 LAB — T4, FREE: Free T4: 1.18 ng/dL — ABNORMAL HIGH (ref 0.61–1.12)

## 2022-04-22 MED ORDER — APIXABAN 5 MG PO TABS: 5.0000 mg | ORAL_TABLET | Freq: Two times a day (BID) | ORAL | 0 refills | Status: DC

## 2022-04-22 MED ORDER — METOPROLOL TARTRATE 25 MG PO TABS: 12.5000 mg | ORAL_TABLET | Freq: Two times a day (BID) | ORAL | 1 refills | Status: DC

## 2022-04-22 MED ORDER — LEVOTHYROXINE SODIUM 88 MCG PO TABS: 88.0000 ug | ORAL_TABLET | Freq: Every day | ORAL | 1 refills | Status: AC

## 2022-04-22 NOTE — TOC Initial Note (Signed)
Transition of Care [facility]) - Initial/Assessment Note    Patient Details  Name: [name] MRN: [id number] Date of Birth: June 11, 1956  Transition of Care [facility]) CM/SW Contact:    Gayla Medicus., RN Phone Number: 04/22/2022, 10:24 AM  Clinical Narrative:     [name] is a 66 y.o. female with medical history significant of  IBS,  hypothyroidism, HLD, CAD, enlarged pituitary sella who presented to ED with complaints of chest pain. She was seen at urgent care and found to be in afib with RVR and sent to ED.  Her chest started to hurt early this AM and was intermittent in nature. Her watch also alerted her as well. Pain substernal, no radiation. Described as squeezing. Pain was severe enough she couldn't sleep. She had associated nausea and shortness of breath. She has no chest pain at this time. She has not had palpitations.   Eliquis card presented to patient by patient's RN, no other needs identified at this time.              Expected Discharge Plan: Home/Self Care Barriers to Discharge: No Barriers Identified  Patient Goals and CMS Choice Patient states their goals for this hospitalization and ongoing recovery are:: retutrn home CMS Medicare.gov Compare Post Acute Care list provided to:: Patient   Expected Discharge Plan and Services Expected Discharge Plan: Home/Self Care   Discharge Planning Services: CM Consult Post Acute Care Choice: NA Living arrangements for the past 2 months: Single Family Home Expected Discharge Date: 04/22/22               DME Arranged: N/A DME Agency: NA       HH Arranged: NA [place] Agency: NA       Prior Living Arrangements/Services Living arrangements for the past 2 months: Single Family Home Lives with:: Self Patient language and need for interpreter reviewed:: Yes Do you feel safe going back to the place where you live?: Yes      Need for Family Participation in Patient Care: No (Comment) Care giver support system in place?: Yes  (comment)   Criminal Activity/Legal Involvement Pertinent to Current Situation/Hospitalization: No - Comment as needed  Activities of Daily Living Home Assistive Devices/Equipment: Blood pressure cuff, Eyeglasses ADL Screening (condition at time of admission) Patient's cognitive ability adequate to safely complete daily activities?: Yes Is the patient deaf or have difficulty hearing?: No Does the patient have difficulty seeing, even when wearing glasses/contacts?: No Does the patient have difficulty concentrating, remembering, or making decisions?: No Patient able to express need for assistance with ADLs?: Yes Does the patient have difficulty dressing or bathing?: No Independently performs ADLs?: Yes (appropriate for developmental age) Does the patient have difficulty walking or climbing stairs?: No Weakness of Legs: None Weakness of Arms/Hands: None  Permission Sought/Granted                 Emotional Assessment Appearance:: Appears stated age Attitude/Demeanor/Rapport: Gracious Affect (typically observed): Accepting Orientation: : Oriented to Self, Oriented to Place, Oriented to  Time, Oriented to Situation   Psych Involvement: No (comment)  Admission diagnosis:  Precordial pain [R07.2] Atrial fibrillation with RVR (HCC) [I48.91] Patient Active Problem List   Diagnosis Date Noted   Atrial fibrillation with RVR ([place]) 04/21/2022   Pituitary tumor 04/21/2022   Chest pain 07/11/2019   Hyperglycemia 06/04/2018   Pulmonary nodules 11/27/2017   Hyperlipidemia 11/27/2017   CAD (coronary artery disease) 11/27/2017   Shingles 08/22/2016   Hypothyroidism 03/17/2010  Vitamin D deficiency 03/17/2010   Osteopenia 03/17/2010   OTH PITUITARY DISORDERS & SYNDROMES 03/04/2009   THYROID NODULE, HX OF 03/04/2007   PCP:  Janie Morning, DO Pharmacy:   [id number] [place] [address]Phone: [phone]:  [phone] [place] [address] [id number]: [phone]: [phone] Social Determinants of Health (SDOH) Interventions Food Insecurity Interventions: Intervention Not Indicated Housing Interventions: Intervention Not Indicated Transportation Interventions: Intervention Not Indicated Utilities Interventions: Intervention Not Indicated  Readmission Risk Interventions     No data to display

## 2022-04-22 NOTE — Care Management Obs Status (Signed)
[facility] NOTIFICATION   Patient Details  Name: [name] MRN: [id number] Date of Birth: 19-Jun-1956   Medicare Observation Status Notification Given:  Yes    Felicite Zeimet G., RN 04/22/2022, 9:16 AM

## 2022-04-22 NOTE — Discharge Summary (Signed)
Physician Discharge Summary  Zailah Zagami RDE:[id number] DOB: [birth year] DOA: 04/21/2022  PCP: Janie Morning, DO  Admit date: 04/21/2022 Discharge date: 04/22/2022  Admitted From: home Disposition:  home  Recommendations for Outpatient Follow-up:  Follow up with PCP in 1-2 weeks Follow-up with cardiology as an outpatient Follow-up with primary endocrinologist as scheduled next month  Home Health: None Equipment/Devices: None  Discharge Condition: Stable CODE STATUS: Full code Diet Orders (From admission, onward)     Start     Ordered   04/21/22 1636  Diet Heart Room service appropriate? Yes; Fluid consistency: Thin  Diet effective now       Question Answer Comment  Room service appropriate? Yes   Fluid consistency: Thin      04/21/22 1637            HPI: Per admitting MD, Normajean Nash is a 66 y.o. female with medical history significant of  IBS,  hypothyroidism, HLD, CAD, enlarged pituitary sella who presented to ED with complaints of chest pain. She was seen at urgent care and found to be in afib with RVR and sent to ED. Her chest started to hurt early this AM and was intermittent in nature. Her watch also alerted her as well. Pain substernal, no radiation. Described as squeezing. Pain was severe enough she couldn't sleep. She had associated nausea and shortness of breath. She has no chest pain at this time. She has not had palpitations. She does not drink any caffeine or energy drinks. Some stress at home. No recent illness. History of heart disease in her mother. He has been feeling good. Denies any fever/chills, palpitations, shortness of breath or cough, abdominal pain, N/V/D, dysuria or leg swelling. She does not smoke or drink.   Hospital Course / Discharge diagnoses: Principal Problem:   Atrial fibrillation with RVR ([place]) Active Problems:   CAD (coronary artery disease)   Hypothyroidism   Pituitary tumor   Hyperlipidemia   Principal problem A-fib with  RVR-patient was admitted to the hospital with palpitations, chest pain, felt to be in A-fib with RVR.  She was placed on diltiazem drip initially, also received metoprolol and converted to sinus rhythm.  Admitting MD discussed with the patient regarding anticoagulation, her CHA2DS2-VASc score was 2, after discussing risks and benefits she was started on Eliquis.  She is tolerating anticoagulation well, will prescribe on discharge.  Active problems CAD-apparently had a coronary calcium test done in [place] in 2018, calcium score of 14.5, plaque in LAD and RCA of 30%.  With improvement in her rate and rhythm, chest pain has resolved.  She is active at baseline and does not have any anginal symptoms.  Troponin was mildly elevated, downtrending, overall flat consistent with demand ischemia in the setting of A-fib with RVR rather than ACS.  2D echo was done which showed an EF of 60-65%, no wall motion abnormalities, RV was normal. Troponin elevation-demand ischemia Pituitary tumor-prolactinoma, outpatient follow-up with neurosurgery Hypothyroidism-she has a history of thyroidectomy for Hurthle cell changes, with a goal TSH of less than 0.3.  Her TSH now is 0.01, quite suppressed, potentially causing her A-fib with RVR.  She has a follow-up with her endocrinologist in a month, I will decrease her Synthroid to 88 mcg from 100. Hyperlipidemia-no longer taking statin  Sepsis ruled out   Discharge Instructions  Discharge Instructions     Amb referral to AFIB Clinic   Complete by: As directed       Allergies  as of 04/22/2022   No Known Allergies      Medication List     STOP taking these medications    naproxen 375 MG tablet Commonly known as: NAPROSYN   ondansetron 8 MG disintegrating tablet Commonly known as: ZOFRAN-ODT       TAKE these medications    apixaban 5 MG Tabs tablet Commonly known as: ELIQUIS Take 1 tablet (5 mg total) by mouth 2 (two) times daily.   atorvastatin 20 MG  tablet Commonly known as: LIPITOR TAKE 1 TABLET BY MOUTH EVERY DAY   cholecalciferol 1000 units tablet Commonly known as: VITAMIN D Take 1 tablet (1,000 Units total) by mouth daily.   latanoprost 0.005 % ophthalmic solution Commonly known as: XALATAN 1 drop at bedtime.   levothyroxine 88 MCG tablet Commonly known as: Synthroid Take 1 tablet (88 mcg total) by mouth daily. What changed:  medication strength how much to take   metoprolol tartrate 25 MG tablet Commonly known as: LOPRESSOR Take 0.5 tablets (12.5 mg total) by mouth 2 (two) times daily.   omeprazole 40 MG capsule Commonly known as: PRILOSEC Take 40 mg by mouth as needed (stomach upset).   predniSONE 5 MG tablet Commonly known as: DELTASONE Take 10 mg by mouth daily.   VITAMIN C PO Take 1 tablet by mouth daily.        Follow-up Information     [facility]. Schedule an appointment as soon as possible for a visit in 2 week(s).   Contact information: [address] [place] [phone]                Consultations: None  Procedures/Studies:  ECHOCARDIOGRAM COMPLETE  Result Date: 04/21/2022    ECHOCARDIOGRAM REPORT   Patient Name:   ALEESHA RINGSTAD Date of Exam: 04/21/2022 Medical Rec #:  [id number]       Height:       64.0 in Accession #:    [id number]      Weight:       127.0 lb Date of Birth:  [birth year]       BSA:          1.613 m Patient Age:    28 years        BP:           104/78 mmHg Patient Gender: F               HR:           89 bpm. Exam Location:  Inpatient Procedure: 2D Echo, Cardiac Doppler and Color Doppler Indications:    Atrial fibrillation  History:        Patient has no prior history of Echocardiogram examinations.                 CAD, Arrythmias:Atrial Fibrillation, Signs/Symptoms:Chest Pain                 and Shortness of Breath; Risk Factors:Dyslipidemia.  Sonographer:    Clayton Lefort RDCS (AE) Referring Phys: [id number] [facility]  Sonographer Comments: Suboptimal apical window and suboptimal subcostal window. IMPRESSIONS  1. Left ventricular ejection fraction, by estimation, is 60 to 65%. The left ventricle has normal function. The left ventricle has no regional wall motion abnormalities. Left ventricular diastolic parameters are indeterminate.  2. Right ventricular systolic function is normal. The right ventricular size is normal.  3. The mitral valve is normal in structure. No evidence of mitral  valve regurgitation. No evidence of mitral stenosis.  4. The aortic valve was not well visualized. There is moderate calcification of the aortic valve. There is moderate thickening of the aortic valve. Aortic valve regurgitation is trivial. Aortic valve sclerosis/calcification is present, without any evidence of aortic stenosis.  5. The inferior vena cava is normal in size with greater than 50% respiratory variability, suggesting right atrial pressure of 3 mmHg. FINDINGS  Left Ventricle: Left ventricular ejection fraction, by estimation, is 60 to 65%. The left ventricle has normal function. The left ventricle has no regional wall motion abnormalities. The left ventricular internal cavity size was normal in size. There is  no left ventricular hypertrophy. Left ventricular diastolic parameters are indeterminate. Right Ventricle: The right ventricular size is normal. No increase in right ventricular wall thickness. Right ventricular systolic function is normal. Left Atrium: Left atrial size was normal in size. Right Atrium: Right atrial size was normal in size. Pericardium: There is no evidence of pericardial effusion. Mitral Valve: The mitral valve is normal in structure. No evidence of mitral valve regurgitation. No evidence of mitral valve stenosis. Tricuspid Valve: The tricuspid valve is normal in structure. Tricuspid valve regurgitation is not demonstrated. No evidence of tricuspid stenosis. Aortic Valve: The aortic valve was not well  visualized. There is moderate calcification of the aortic valve. There is moderate thickening of the aortic valve. Aortic valve regurgitation is trivial. Aortic regurgitation PHT measures 243 msec. Aortic valve  sclerosis/calcification is present, without any evidence of aortic stenosis. Aortic valve mean gradient measures 2.0 mmHg. Aortic valve peak gradient measures 3.2 mmHg. Aortic valve area, by VTI measures 3.27 cm. Pulmonic Valve: The pulmonic valve was normal in structure. Pulmonic valve regurgitation is not visualized. No evidence of pulmonic stenosis. Aorta: The aortic root is normal in size and structure. Venous: The inferior vena cava is normal in size with greater than 50% respiratory variability, suggesting right atrial pressure of 3 mmHg. IAS/Shunts: No atrial level shunt detected by color flow Doppler.  LEFT VENTRICLE PLAX 2D LVIDd:         3.30 cm LVIDs:         2.40 cm LV PW:         1.10 cm LV IVS:        1.00 cm LVOT diam:     2.10 cm LV SV:         54 LV SV Index:   33 LVOT Area:     3.46 cm  RIGHT VENTRICLE RV Basal diam:  2.30 cm RV S prime:     10.40 cm/s TAPSE (M-mode): 1.8 cm LEFT ATRIUM             Index        RIGHT ATRIUM           Index LA diam:        3.70 cm 2.29 cm/m   RA Area:     10.60 cm LA Vol (A2C):   33.6 ml 20.83 ml/m  RA Volume:   21.30 ml  13.21 ml/m LA Vol (A4C):   33.9 ml 21.02 ml/m LA Biplane Vol: 34.1 ml 21.14 ml/m  AORTIC VALVE AV Area (Vmax):    3.45 cm AV Area (Vmean):   3.45 cm AV Area (VTI):     3.27 cm AV Vmax:           89.00 cm/s AV Vmean:          57.300 cm/s AV VTI:  0.165 m AV Peak Grad:      3.2 mmHg AV Mean Grad:      2.0 mmHg LVOT Vmax:         88.60 cm/s LVOT Vmean:        57.100 cm/s LVOT VTI:          0.156 m LVOT/AV VTI ratio: 0.95 AI PHT:            243 msec  AORTA Ao Root diam: 3.20 cm  SHUNTS Systemic VTI:  0.16 m Systemic Diam: 2.10 cm Jenkins Rouge MD Electronically signed by Jenkins Rouge MD Signature Date/Time: 04/21/2022/5:34:05  PM    Final    DG Chest Port 1 View  Result Date: 04/21/2022 CLINICAL DATA:  Chest pain. EXAM: PORTABLE CHEST 1 VIEW COMPARISON:  CT chest 01/31/2018. FINDINGS: Trachea is midline. Thyroidectomy. Heart size is accentuated by AP semi upright technique. Mild bibasilar streaky atelectasis. No airspace consolidation or pleural fluid. IMPRESSION: Mild bibasilar subsegmental atelectasis. Electronically Signed   By: Lorin Picket M.D.   On: 04/21/2022 15:43     Subjective: - no chest pain, shortness of breath, no abdominal pain, nausea or vomiting.   Discharge Exam: BP 92/72 (BP Location: Left Arm)   Pulse 60   Temp 98.1 F (36.7 C) (Oral)   Resp 16   Ht '5\' 4"'$  (1.626 m)   Wt 57.6 kg   SpO2 99%   BMI 21.80 kg/m   General: Pt is alert, awake, not in acute distress Cardiovascular: RRR, S1/S2 +, no rubs, no gallops Respiratory: CTA bilaterally, no wheezing, no rhonchi Abdominal: Soft, NT, ND, bowel sounds + Extremities: no edema, no cyanosis    The results of significant diagnostics from this hospitalization (including imaging, microbiology, ancillary and laboratory) are listed below for reference.     Microbiology: No results found for this or any previous visit (from the past 240 hour(s)).   Labs: Basic Metabolic Panel: Recent Labs  Lab 04/21/22 1434 04/21/22 1438 04/22/22 0201  NA 140  --  144  K 4.4  --  3.8  CL 112*  --  112*  CO2 21*  --  24  GLUCOSE 109*  --  88  BUN 23  --  16  CREATININE 0.71  --  0.75  CALCIUM 8.8*  --  7.8*  MG  --  2.4  --    Liver Function Tests: Recent Labs  Lab 04/21/22 1434  AST 28  ALT 20  ALKPHOS 52  BILITOT 0.7  PROT 6.2*  ALBUMIN 3.8   CBC: Recent Labs  Lab 04/21/22 1434 04/22/22 0201  WBC 10.2 8.9  NEUTROABS 8.0*  --   HGB 14.9 13.5  HCT 44.9 39.8  MCV 93.9 93.9  PLT 231 190   CBG: No results for input(s): "GLUCAP" in the last 168 hours. Hgb A1c No results for input(s): "HGBA1C" in the last 72 hours. Lipid  Profile No results for input(s): "CHOL", "HDL", "LDLCALC", "TRIG", "CHOLHDL", "LDLDIRECT" in the last 72 hours. Thyroid function studies Recent Labs    04/21/22 1438  TSH 0.010*   Urinalysis    Component Value Date/Time   COLORURINE STRAW (A) 02/17/2022 1931   APPEARANCEUR CLEAR 02/17/2022 1931   LABSPEC 1.008 02/17/2022 1931   PHURINE 6.0 02/17/2022 1931   GLUCOSEU NEGATIVE 02/17/2022 1931   HGBUR NEGATIVE 02/17/2022 1931   BILIRUBINUR NEGATIVE 02/17/2022 1931   KETONESUR 5 (A) 02/17/2022 1931   PROTEINUR NEGATIVE 02/17/2022 1931   NITRITE NEGATIVE 02/17/2022  [place] 02/17/2022 1931    FURTHER DISCHARGE INSTRUCTIONS:   Get Medicines reviewed and adjusted: Please take all your medications with you for your next visit with your Primary MD   Laboratory/radiological data: Please request your Primary MD to go over all hospital tests and procedure/radiological results at the follow up, please ask your Primary MD to get all Hospital records sent to his/her office.   In some cases, they will be blood work, cultures and biopsy results pending at the time of your discharge. Please request that your primary care M.D. goes through all the records of your hospital data and follows up on these results.   Also Note the following: If you experience worsening of your admission symptoms, develop shortness of breath, life threatening emergency, suicidal or homicidal thoughts you must seek medical attention immediately by calling 911 or calling your MD immediately  if symptoms less severe.   You must read complete instructions/literature along with all the possible adverse reactions/side effects for all the Medicines you take and that have been prescribed to you. Take any new Medicines after you have completely understood and accpet all the possible adverse reactions/side effects.    Do not drive when taking Pain medications or sleeping medications (Benzodaizepines)   Do  not take more than prescribed Pain, Sleep and Anxiety Medications. It is not advisable to combine anxiety,sleep and pain medications without talking with your primary care practitioner   Special Instructions: If you have smoked or chewed Tobacco  in the last 2 yrs please stop smoking, stop any regular Alcohol  and or any Recreational drug use.   Wear Seat belts while driving.   Please note: You were cared for by a hospitalist during your hospital stay. Once you are discharged, your primary care physician will handle any further medical issues. Please note that NO REFILLS for any discharge medications will be authorized once you are discharged, as it is imperative that you return to your primary care physician (or establish a relationship with a primary care physician if you do not have one) for your post hospital discharge needs so that they can reassess your need for medications and monitor your lab values.  Time coordinating discharge: 40 minutes  SIGNED:  Marzetta Board, MD, PhD 04/22/2022, 8:55 AM

## 2022-05-04 DIAGNOSIS — I2510 Atherosclerotic heart disease of native coronary artery without angina pectoris: Secondary | ICD-10-CM | POA: Diagnosis not present

## 2022-05-04 DIAGNOSIS — E890 Postprocedural hypothyroidism: Secondary | ICD-10-CM | POA: Diagnosis not present

## 2022-05-04 DIAGNOSIS — Z09 Encounter for follow-up examination after completed treatment for conditions other than malignant neoplasm: Secondary | ICD-10-CM | POA: Diagnosis not present

## 2022-05-04 DIAGNOSIS — E237 Disorder of pituitary gland, unspecified: Secondary | ICD-10-CM | POA: Diagnosis not present

## 2022-05-04 DIAGNOSIS — Z8679 Personal history of other diseases of the circulatory system: Secondary | ICD-10-CM | POA: Diagnosis not present

## 2022-05-08 ENCOUNTER — Encounter (HOSPITAL_COMMUNITY): Payer: Self-pay | Admitting: Nurse Practitioner

## 2022-05-08 ENCOUNTER — Ambulatory Visit (HOSPITAL_COMMUNITY)
Admission: RE | Admit: 2022-05-08 | Discharge: 2022-05-08 | Disposition: A | Discharge status: Home / Self Care | Payer: Medicare Other | Source: Ambulatory Visit | Attending: Nurse Practitioner | Admitting: Nurse Practitioner

## 2022-05-08 VITALS — BP 120/76 | HR 51 | Ht 64.0 in | Wt 132.4 lb

## 2022-05-08 DIAGNOSIS — E039 Hypothyroidism, unspecified: Secondary | ICD-10-CM | POA: Insufficient documentation

## 2022-05-08 DIAGNOSIS — D6869 Other thrombophilia: Secondary | ICD-10-CM | POA: Diagnosis not present

## 2022-05-08 DIAGNOSIS — Z7901 Long term (current) use of anticoagulants: Secondary | ICD-10-CM | POA: Diagnosis not present

## 2022-05-08 DIAGNOSIS — I2510 Atherosclerotic heart disease of native coronary artery without angina pectoris: Secondary | ICD-10-CM | POA: Diagnosis not present

## 2022-05-08 DIAGNOSIS — E785 Hyperlipidemia, unspecified: Secondary | ICD-10-CM | POA: Diagnosis not present

## 2022-05-08 DIAGNOSIS — Z79899 Other long term (current) drug therapy: Secondary | ICD-10-CM | POA: Insufficient documentation

## 2022-05-08 DIAGNOSIS — K589 Irritable bowel syndrome without diarrhea: Secondary | ICD-10-CM | POA: Insufficient documentation

## 2022-05-08 DIAGNOSIS — I4891 Unspecified atrial fibrillation: Secondary | ICD-10-CM | POA: Insufficient documentation

## 2022-05-08 MED ORDER — METOPROLOL SUCCINATE ER 25 MG PO TB24: 12.5000 mg | ORAL_TABLET | Freq: Every day | ORAL | 6 refills | Status: DC

## 2022-05-08 MED ORDER — APIXABAN 5 MG PO TABS: 5.0000 mg | ORAL_TABLET | Freq: Two times a day (BID) | ORAL | 1 refills | Status: DC

## 2022-05-08 NOTE — Progress Notes (Signed)
Primary Care Physician: Janie Morning, DO Referring Physician: [facility] admission  f/u   [name] is a 66 y.o. female with a h/o  IBS,  hypothyroidism, HLD, CAD, enlarged pituitary sella who presented to ED with complaints of chest pain. She was seen at urgent care and found to be in afib with RVR and sent to ED. Her chest started to hurt early this AM and was intermittent in nature. Her watch also alerted her as well. Pain substernal, no radiation. Described as squeezing. Pain was severe enough she couldn't sleep. She had associated nausea and shortness of breath. She has no chest pain at this time. She has not had palpitations. She does not drink any caffeine or energy drinks. Some stress at home. No recent illness. History of heart disease in her mother. He has been feeling good. Denies any fever/chills, palpitations, shortness of breath or cough, abdominal pain, N/V/D, dysuria or leg swelling. She does not smoke or drink. she is here today with interpretor and son. She had her husband live locally.   She was found to be in afib at 128 bpm. She was placed on diltiazem drip initially, also received metoprolol and converted to sinus rhythm.  Admitting MD discussed with the patient regarding anticoagulation, her CHA2DS2-VASc score was 2, after discussing risks and benefits she was started on Eliquis.   She apparently had a coronary calcium test done in [place] in 2018, calcium score of 14.5, plaque in LAD and RCA of 30%. With improvement in her rate and rhythm, chest pain  resolved.  She is active at baseline and does not have any anginal symptoms.  Troponin was mildly elevated, downtrending, overall flat consistent with demand ischemia in the setting of A-fib with RVR rather than ACS.  2D echo was done which showed an EF of 60-65%, no wall motion abnormalities, RV was normal. Troponin elevation was thought to be demand ischemia. She has a h/o thyroidectomy for Hurthle cell changes, with a goal TSH of  less than 0.3.  Her TSH now is 0.01, quite suppressed, potentially causing her A-fib with RVR.  She has a follow-up with her endocrinologist in a month,and her Synthroid was decreased  to 88 mcg from 100. She has an outpatient  f/u with neurosurgery for her pituitary tumor.    Today, she denies symptoms of palpitations, chest pain, shortness of breath, orthopnea, PND, lower extremity edema, dizziness, presyncope, syncope, or neurologic sequela. The patient is tolerating medications without difficulties and is otherwise without complaint today.   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 60 tablet 0   Ascorbic Acid (VITAMIN C PO) Take 1 tablet by mouth daily.     atorvastatin (LIPITOR) 20 MG tablet TAKE 1 TABLET BY MOUTH EVERY DAY (Patient not taking: Reported on 04/21/2022) 90  tablet 1   cholecalciferol (VITAMIN D) 1000 units tablet Take 1 tablet (1,000 Units total) by mouth daily.     latanoprost (XALATAN) 0.005 % ophthalmic solution 1 drop at bedtime.     levothyroxine (SYNTHROID) 88 MCG tablet Take 1 tablet (88 mcg total) by mouth daily. 30 tablet 1   metoprolol tartrate (LOPRESSOR) 25 MG tablet Take 0.5 tablets (12.5 mg total) by mouth 2 (two) times daily. 30 tablet 1   omeprazole (PRILOSEC) 40 MG capsule Take 40 mg by mouth as needed (stomach upset).     predniSONE (DELTASONE) 5 MG tablet Take 10 mg by mouth  daily.     No current facility-administered medications for this encounter.    No Known Allergies  Social History   Socioeconomic History   Marital status: Married    Spouse name: mun sic cho   Number of children: 1   Years of education: Not on file   Highest education level: Not on file  Occupational History   Not on file  Tobacco Use   Smoking status: Never   Smokeless tobacco: Never  Vaping Use   Vaping Use: Not on file  Substance and Sexual Activity   Alcohol use: No   Drug use: No   Sexual activity: Yes    Partners: Female  Other Topics Concern   Not on file  Social History Narrative   Not on file   Social Determinants of Health   Financial Resource Strain: Not on file  Food Insecurity: No Food Insecurity (04/21/2022)   Hunger Vital Sign    Worried About Running Out of Food in the Last Year: Never true    Ran Out of Food in the Last Year: Never true  Transportation Needs: No Transportation Needs (04/21/2022)   PRAPARE - Hydrologist (Medical): No    Lack of Transportation (Non-Medical): No  Physical Activity: Not on file  Stress: Not on file  Social Connections: Not on file  Intimate Partner Violence: Not on file    Family History  Problem Relation Age of Onset   Heart disease Neg Hx    Stroke Neg Hx    Diabetes Neg Hx     ROS- All systems are reviewed and negative except as per the HPI above  Physical Exam: There were no vitals filed for this visit. Wt Readings from Last 3 Encounters:  04/21/22 57.6 kg  07/11/19 66.7 kg  06/04/18 61.7 kg    Labs: Lab Results  Component Value Date   NA 144 04/22/2022   K 3.8 04/22/2022   CL 112 (H) 04/22/2022   CO2 24 04/22/2022   GLUCOSE 88 04/22/2022   BUN 16 04/22/2022   CREATININE 0.75 04/22/2022   CALCIUM 7.8 (L) 04/22/2022   MG 2.4 04/21/2022   No results found for: "INR" Lab Results  Component Value Date   CHOL 203 (H) 07/11/2019   HDL 52.10 07/11/2019   LDLCALC  120 (H) 07/11/2019   TRIG 159.0 (H) 07/11/2019     GEN- The patient is well appearing, alert and oriented x 3 today.   Head- normocephalic, atraumatic Eyes-  Sclera clear, conjunctiva pink Ears- hearing intact Oropharynx- clear Neck- supple, no JVP Lymph- no cervical lymphadenopathy Lungs- Clear to ausculation bilaterally, normal work of breathing Heart- Regular rate and rhythm, no murmurs, rubs or gallops, PMI not laterally displaced GI- soft, NT, ND, + BS Extremities- no clubbing, cyanosis, or edema MS- no significant deformity or atrophy Skin- no  rash or lesion Psych- euthymic mood, full affect Neuro- strength and sensation are intact  Echo-- 1. Left ventricular ejection fraction, by estimation, is 60 to 65%. The  left ventricle has normal function. The left ventricle has no regional  wall motion abnormalities. Left ventricular diastolic parameters are  indeterminate.   2. Right ventricular systolic function is normal. The right ventricular  size is normal.   3. The mitral valve is normal in structure. No evidence of mitral valve  regurgitation. No evidence of mitral stenosis.   4. The aortic valve was not well visualized. There is moderate  calcification of the aortic valve. There is moderate thickening of the  aortic valve. Aortic valve regurgitation is trivial. Aortic valve  sclerosis/calcification is present, without any  evidence of aortic stenosis.   5. The inferior vena cava is normal in size with greater than 50%  respiratory variability, suggesting right atrial pressure of 3 mmHg.   Ekg-  Assessment and Plan:  1. Afib  New onset afib  In SR since leaving the hospital Has a apple watch for home monitoring  General education re afib and triggers discussed Continue metoprolol 12.5 mg at hs,(decreased by PCP for soft BP)  but change from  tartrate to succinate   2. CHA2DS2VASc  score of at least 3  Continue eliquis 5 mg bid  Stressed importance not to miss any  doses  Bleeding precautions discussed   3. Pituitary tumor and hypothyroidism  Has f/u with endocrinologist this Thursday    F/u in one month Will obtain cbc/ bmet then, new to anticoagulation   Butch Penny C. Takisha Pelle, [facility] [address] [place], [place] [address] [phone]

## 2022-05-08 NOTE — Patient Instructions (Signed)
STOP metoprolol tartrate    START metoprolol succinate 1/2 tablet once a day at bedtime

## 2022-05-10 DIAGNOSIS — D352 Benign neoplasm of pituitary gland: Secondary | ICD-10-CM | POA: Diagnosis not present

## 2022-05-10 DIAGNOSIS — H401212 Low-tension glaucoma, right eye, moderate stage: Secondary | ICD-10-CM | POA: Diagnosis not present

## 2022-05-10 DIAGNOSIS — H401221 Low-tension glaucoma, left eye, mild stage: Secondary | ICD-10-CM | POA: Diagnosis not present

## 2022-05-11 DIAGNOSIS — E230 Hypopituitarism: Secondary | ICD-10-CM | POA: Diagnosis not present

## 2022-05-11 DIAGNOSIS — E890 Postprocedural hypothyroidism: Secondary | ICD-10-CM | POA: Diagnosis not present

## 2022-05-11 DIAGNOSIS — D352 Benign neoplasm of pituitary gland: Secondary | ICD-10-CM | POA: Diagnosis not present

## 2022-05-11 DIAGNOSIS — D497 Neoplasm of unspecified behavior of endocrine glands and other parts of nervous system: Secondary | ICD-10-CM | POA: Diagnosis not present

## 2022-05-18 DIAGNOSIS — D352 Benign neoplasm of pituitary gland: Secondary | ICD-10-CM | POA: Diagnosis not present

## 2022-06-08 ENCOUNTER — Ambulatory Visit (HOSPITAL_COMMUNITY)
Admission: RE | Admit: 2022-06-08 | Discharge: 2022-06-08 | Disposition: A | Discharge status: Home / Self Care | Payer: Medicare Other | Source: Ambulatory Visit | Attending: Nurse Practitioner | Admitting: Nurse Practitioner

## 2022-06-08 VITALS — BP 132/82 | HR 51 | Ht 64.0 in | Wt 130.4 lb

## 2022-06-08 DIAGNOSIS — I2510 Atherosclerotic heart disease of native coronary artery without angina pectoris: Secondary | ICD-10-CM | POA: Insufficient documentation

## 2022-06-08 DIAGNOSIS — E039 Hypothyroidism, unspecified: Secondary | ICD-10-CM | POA: Insufficient documentation

## 2022-06-08 DIAGNOSIS — K589 Irritable bowel syndrome without diarrhea: Secondary | ICD-10-CM | POA: Diagnosis not present

## 2022-06-08 DIAGNOSIS — I4891 Unspecified atrial fibrillation: Secondary | ICD-10-CM | POA: Diagnosis not present

## 2022-06-08 DIAGNOSIS — E785 Hyperlipidemia, unspecified: Secondary | ICD-10-CM | POA: Insufficient documentation

## 2022-06-08 DIAGNOSIS — D6869 Other thrombophilia: Secondary | ICD-10-CM | POA: Diagnosis not present

## 2022-06-08 LAB — CBC
HCT: 44.5 % (ref 36.0–46.0)
Hemoglobin: 14.5 g/dL (ref 12.0–15.0)
MCH: 31.7 pg (ref 26.0–34.0)
MCHC: 32.6 g/dL (ref 30.0–36.0)
MCV: 97.4 fL (ref 80.0–100.0)
Platelets: 204 10*3/uL (ref 150–400)
RBC: 4.57 MIL/uL (ref 3.87–5.11)
RDW: 13 % (ref 11.5–15.5)
WBC: 8.6 10*3/uL (ref 4.0–10.5)
nRBC: 0 % (ref 0.0–0.2)

## 2022-06-08 LAB — BASIC METABOLIC PANEL
Anion gap: 6 (ref 5–15)
BUN: 22 mg/dL (ref 8–23)
CO2: 27 mmol/L (ref 22–32)
Calcium: 8.7 mg/dL — ABNORMAL LOW (ref 8.9–10.3)
Chloride: 104 mmol/L (ref 98–111)
Creatinine, Ser: 0.69 mg/dL (ref 0.44–1.00)
GFR, Estimated: >60 mL/min (ref >60)
Glucose, Bld: 92 mg/dL (ref 70–99)
Potassium: 3.9 mmol/L (ref 3.5–5.1)
Sodium: 137 mmol/L (ref 135–145)

## 2022-06-08 NOTE — Progress Notes (Signed)
Primary Care Physician: Janie Morning, DO Referring Physician: [facility] admission  f/u   [name] is a 66 y.o. female with a h/o  IBS,  hypothyroidism, HLD, CAD, enlarged pituitary sella who presented to ED with complaints of chest pain. She was seen at urgent care and found to be in afib with RVR and sent to ED. Her chest started to hurt early this AM and was intermittent in nature. Her watch also alerted her as well. Pain substernal, no radiation. Described as squeezing. Pain was severe enough she couldn't sleep. She had associated nausea and shortness of breath. She has no chest pain at this time. She has not had palpitations. She does not drink any caffeine or energy drinks. Some stress at home. No recent illness. History of heart disease in her mother. He has been feeling good. Denies any fever/chills, palpitations, shortness of breath or cough, abdominal pain, N/V/D, dysuria or leg swelling. She does not smoke or drink. she is here today with interpretor and son. She had her husband live locally.   She was found to be in afib at 128 bpm. She was placed on diltiazem drip initially, also received metoprolol and converted to sinus rhythm.  Admitting MD discussed with the patient regarding anticoagulation, her CHA2DS2-VASc score was 2, after discussing risks and benefits she was started on Eliquis.   She apparently had a coronary calcium test done in [place] in 2018, calcium score of 14.5, plaque in LAD and RCA of 30%. With improvement in her rate and rhythm, chest pain  resolved.  She is active at baseline and does not have any anginal symptoms.  Troponin was mildly elevated, downtrending, overall flat consistent with demand ischemia in the setting of A-fib with RVR rather than ACS.  2D echo was done which showed an EF of 60-65%, no wall motion abnormalities, RV was normal. Troponin elevation was thought to be demand ischemia.  She has a h/o thyroidectomy for Hurthle cell changes, with a goal TSH  of less than 0.3.  Her TSH now is 0.01, quite suppressed, potentially causing her A-fib with RVR.  She has a follow-up with her endocrinologist in a month,and her Synthroid was decreased  to 88 mcg from 100. She has an outpatient  f/u with neurosurgery for her pituitary tumor.    F/u in afib clinic, 06/07/22. No further afib to report. She is with interpretor and her son. Her son states that she may be pending surgery for  her pituitary tumor in the near future. She has not had any issues with anticoagulation.  Today, she denies symptoms of palpitations, chest pain, shortness of breath, orthopnea, PND, lower extremity edema, dizziness, presyncope, syncope, or neurologic sequela. The patient is tolerating medications without difficulties and is otherwise without complaint today.   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     apixaban (ELIQUIS) 5 MG TABS tablet Take 1 tablet (5 mg total) by mouth 2 (two) times daily. 180 tablet 1   Ascorbic Acid (VITAMIN C PO) Take 1 tablet by mouth daily.     atorvastatin (LIPITOR) 20 MG tablet TAKE 1 TABLET BY MOUTH EVERY DAY (Patient not taking: Reported on 04/21/2022) 90 tablet 1   cholecalciferol (VITAMIN D) 1000 units tablet Take 1 tablet (1,000 Units total) by mouth daily.     latanoprost (XALATAN) 0.005 % ophthalmic solution 1 drop at bedtime.     levothyroxine (SYNTHROID) 88 MCG tablet Take 1  tablet (88 mcg total) by mouth daily. 30 tablet 1   metoprolol succinate (TOPROL XL) 25 MG 24 hr tablet Take 0.5 tablets (12.5 mg total) by mouth at bedtime. 30 tablet 6   omeprazole (PRILOSEC) 40 MG capsule Take 40 mg by mouth as needed (stomach upset).     predniSONE (DELTASONE) 5 MG tablet Take 10 mg by mouth daily.     sucralfate (CARAFATE) 1 g tablet 1 tablet on an empty stomach Orally Twice a day as needed     No current facility-administered medications for this encounter.    No Known Allergies  Social History   Socioeconomic History   Marital status: Married    Spouse name: mun sic cho   Number of children: 1   Years of education: Not on file   Highest education level: Not on file  Occupational History   Not on file  Tobacco Use   Smoking status: Never   Smokeless tobacco: Never  Vaping Use   Vaping Use: Not on file  Substance and Sexual Activity   Alcohol use: No   Drug use: No   Sexual activity: Yes    Partners: Female  Other Topics Concern   Not on file  Social History Narrative   Not on file   Social Determinants of Health   Financial Resource Strain: Not on file  Food Insecurity: No Food Insecurity (04/21/2022)   Hunger Vital Sign    Worried About Running Out of Food in the Last Year: Never true    Ran Out of Food in the Last Year: Never true  Transportation Needs: No Transportation Needs (04/21/2022)   PRAPARE - Hydrologist (Medical): No    Lack of Transportation (Non-Medical): No  Physical Activity: Not on file  Stress: Not on file  Social Connections: Not on file  Intimate Partner Violence: Not on file    Family History  Problem Relation Age of Onset   Heart disease Neg Hx    Stroke Neg Hx    Diabetes Neg Hx     ROS- All systems are reviewed and negative except as per the HPI above  Physical Exam: There were no vitals filed for this visit. Wt Readings from Last 3 Encounters:  05/08/22 60.1 kg  04/21/22 57.6  kg  07/11/19 66.7 kg    Labs: Lab Results  Component Value Date   NA 144 04/22/2022   K 3.8 04/22/2022   CL 112 (H) 04/22/2022   CO2 24 04/22/2022   GLUCOSE 88 04/22/2022   BUN 16 04/22/2022   CREATININE 0.75 04/22/2022   CALCIUM 7.8 (L) 04/22/2022   MG 2.4 04/21/2022   No results found for: "INR" Lab Results  Component Value Date   CHOL 203 (H) 07/11/2019   HDL 52.10 07/11/2019   LDLCALC 120 (H) 07/11/2019   TRIG 159.0 (H)  07/11/2019     GEN- The patient is well appearing, alert and oriented x 3 today.   Head- normocephalic, atraumatic Eyes-  Sclera clear, conjunctiva pink Ears- hearing intact Oropharynx- clear Neck- supple, no JVP Lymph- no cervical lymphadenopathy Lungs- Clear to ausculation bilaterally, normal work of breathing Heart- Regular rate and rhythm, no murmurs, rubs or gallops, PMI not laterally displaced GI- soft, NT, ND, + BS Extremities- no clubbing, cyanosis, or edema MS- no significant deformity or atrophy Skin- no rash or lesion Psych- euthymic mood, full affect Neuro- strength and sensation are intact  Echo-- 1. Left ventricular ejection fraction, by estimation, is 60 to 65%. The  left ventricle has normal function. The left ventricle has no regional  wall motion abnormalities. Left ventricular diastolic parameters are  indeterminate.   2. Right ventricular systolic function is normal. The right ventricular  size is normal.   3. The mitral valve is normal in structure. No evidence of mitral valve  regurgitation. No evidence of mitral stenosis.   4. The aortic valve was not well visualized. There is moderate  calcification of the aortic valve. There is moderate thickening of the  aortic valve. Aortic valve regurgitation is trivial. Aortic valve  sclerosis/calcification is present, without any  evidence of aortic stenosis.   5. The inferior vena cava is normal in size with greater than 50%  respiratory variability, suggesting right atrial  pressure of 3 mmHg.   Ekg-Vent. rate 51 BPM PR interval 168 ms QRS duration 68 ms QT/QTcB 420/387 ms P-R-T axes 34 54 -10 Sinus bradycardia Nonspecific T wave abnormality Abnormal ECG When compared with ECG of 08-May-2022 10:19, PREVIOUS ECG IS PRESENT  Assessment and Plan:  1. Afib  New onset afib noted during hospitalization in September   In SR since leaving the hospital Has a apple watch for home monitoring  Continue metoprolol 12.5 mg at hs,(decreased by PCP for soft BP)  but change from  tartrate to succinate   2. CHA2DS2VASc  score of at least 3  Continue eliquis 5 mg bid  Stressed importance not to miss any doses  Bleeding precautions discussed  Cbc/bmet today   3. Pituitary tumor and hypothyroidism  Per endocrinologist    F/u in 6 months   Butch Penny C. Rosser [place], [facility] [address] [place], [place] [address] [phone]

## 2022-08-17 DIAGNOSIS — D352 Benign neoplasm of pituitary gland: Secondary | ICD-10-CM | POA: Diagnosis not present

## 2022-08-17 DIAGNOSIS — E230 Hypopituitarism: Secondary | ICD-10-CM | POA: Diagnosis not present

## 2022-08-17 DIAGNOSIS — E890 Postprocedural hypothyroidism: Secondary | ICD-10-CM | POA: Diagnosis not present

## 2022-08-17 DIAGNOSIS — D497 Neoplasm of unspecified behavior of endocrine glands and other parts of nervous system: Secondary | ICD-10-CM | POA: Diagnosis not present

## 2022-08-31 ENCOUNTER — Encounter (HOSPITAL_COMMUNITY): Payer: Self-pay | Admitting: *Deleted

## 2022-09-11 DIAGNOSIS — H2513 Age-related nuclear cataract, bilateral: Secondary | ICD-10-CM | POA: Diagnosis not present

## 2022-09-11 DIAGNOSIS — H43813 Vitreous degeneration, bilateral: Secondary | ICD-10-CM | POA: Diagnosis not present

## 2022-09-11 DIAGNOSIS — H401212 Low-tension glaucoma, right eye, moderate stage: Secondary | ICD-10-CM | POA: Diagnosis not present

## 2022-09-11 DIAGNOSIS — H401221 Low-tension glaucoma, left eye, mild stage: Secondary | ICD-10-CM | POA: Diagnosis not present

## 2022-10-17 DIAGNOSIS — D352 Benign neoplasm of pituitary gland: Secondary | ICD-10-CM | POA: Diagnosis not present

## 2022-11-03 DIAGNOSIS — E230 Hypopituitarism: Secondary | ICD-10-CM | POA: Diagnosis not present

## 2022-11-03 DIAGNOSIS — D352 Benign neoplasm of pituitary gland: Secondary | ICD-10-CM | POA: Diagnosis not present

## 2022-11-21 DIAGNOSIS — Z01818 Encounter for other preprocedural examination: Secondary | ICD-10-CM | POA: Diagnosis not present

## 2022-11-21 DIAGNOSIS — E237 Disorder of pituitary gland, unspecified: Secondary | ICD-10-CM | POA: Diagnosis not present

## 2022-11-29 DIAGNOSIS — D352 Benign neoplasm of pituitary gland: Secondary | ICD-10-CM | POA: Diagnosis not present

## 2022-12-05 ENCOUNTER — Ambulatory Visit (HOSPITAL_COMMUNITY)
Admission: RE | Admit: 2022-12-05 | Discharge: 2022-12-05 | Disposition: A | Discharge status: Home / Self Care | Payer: Medicare Other | Source: Ambulatory Visit | Attending: Physician Assistant | Admitting: Physician Assistant

## 2022-12-05 VITALS — BP 124/84 | HR 61 | Ht 64.0 in | Wt 126.0 lb

## 2022-12-05 DIAGNOSIS — I480 Paroxysmal atrial fibrillation: Secondary | ICD-10-CM | POA: Insufficient documentation

## 2022-12-05 DIAGNOSIS — Z7901 Long term (current) use of anticoagulants: Secondary | ICD-10-CM | POA: Diagnosis not present

## 2022-12-05 DIAGNOSIS — I2510 Atherosclerotic heart disease of native coronary artery without angina pectoris: Secondary | ICD-10-CM | POA: Insufficient documentation

## 2022-12-05 DIAGNOSIS — E785 Hyperlipidemia, unspecified: Secondary | ICD-10-CM | POA: Insufficient documentation

## 2022-12-05 DIAGNOSIS — I4891 Unspecified atrial fibrillation: Secondary | ICD-10-CM | POA: Diagnosis not present

## 2022-12-05 DIAGNOSIS — D6869 Other thrombophilia: Secondary | ICD-10-CM | POA: Insufficient documentation

## 2022-12-05 NOTE — Progress Notes (Signed)
Primary Care Physician: Irena Reichmann, DO Referring Physician: [facility] admission  f/u   Naviya Hetz is a 67 y.o. female with a h/o  IBS,  hypothyroidism, HLD, CAD, enlarged pituitary sella who presented to ED with complaints of chest pain. She was seen at urgent care and found to be in afib with RVR and sent to ED. Her chest started to hurt early this AM and was intermittent in nature. Her watch also alerted her as well. Pain substernal, no radiation. Described as squeezing. Pain was severe enough she couldn't sleep. She had associated nausea and shortness of breath. She has no chest pain at this time. She has not had palpitations. She does not drink any caffeine or energy drinks. Some stress at home. No recent illness. History of heart disease in her mother. He has been feeling good. Denies any fever/chills, palpitations, shortness of breath or cough, abdominal pain, N/V/D, dysuria or leg swelling. She does not smoke or drink. she is here today with interpretor and son. She had her husband live locally.   She was found to be in afib at 128 bpm. She was placed on diltiazem drip initially, also received metoprolol and converted to sinus rhythm.  Admitting MD discussed with the patient regarding anticoagulation, she was started on Eliquis.   She apparently had a coronary calcium test done in [place] in 2018, calcium score of 14.5, plaque in LAD and RCA of 30%. With improvement in her rate and rhythm, chest pain  resolved.  She is active at baseline and does not have any anginal symptoms.  Troponin was mildly elevated, downtrending, overall flat consistent with demand ischemia in the setting of A-fib with RVR rather than ACS.  2D echo was done which showed an EF of 60-65%, no wall motion abnormalities, RV was normal. Troponin elevation was thought to be demand ischemia.  She has a h/o thyroidectomy for Hurthle cell changes, with a goal TSH of less than 0.3.  Her TSH now is 0.01, quite suppressed,  potentially causing her A-fib with RVR.  She has a follow-up with her endocrinologist in a month,and her Synthroid was decreased  to 88 mcg from 100. She has an outpatient  f/u with neurosurgery for her pituitary tumor.    F/u in afib clinic, 06/07/22. No further afib to report. She is with interpretor and her son. Her son states that she may be pending surgery for  her pituitary tumor in the near future. She has not had any issues with anticoagulation.  Follow up in the AF clinic 12/05/22. An interpreter was used for today's visit. Patient reports that she has done well since her last visit. She has not had any known episodes of afib. No bleeding issues on anticoagulation. She is followed by neurosurgery for a pituitary tumor.   Today, she denies symptoms of palpitations, chest pain, shortness of breath, orthopnea, PND, lower extremity edema, dizziness, presyncope, syncope, or neurologic sequela. The patient is tolerating medications without difficulties and is otherwise without complaint today.   Past Medical History:  Diagnosis Date   Helicobacter pylori gastritis 2001   IBS (irritable bowel syndrome)    Multinodular goiter 2001   Hurtle cell changes on FNA   Pituitary abnormality (HCC) 1990   Enlarged sella; Dr Newell Coral   Past Surgical History:  Procedure Laterality Date   APPENDECTOMY     COLONOSCOPY W/ POLYPECTOMY  2010   [facility] ( as per her son)but possibly in [place] 2011   THYROIDECTOMY  2002   Hurthle  cell changes on biopsy; TSH goal = < 0.3    Current Outpatient Medications  Medication Sig Dispense Refill   alendronate (FOSAMAX) 70 MG tablet Take 70 mg by mouth once a week.     apixaban (ELIQUIS) 5 MG TABS tablet Take 1 tablet (5 mg total) by mouth 2 (two) times daily. 180 tablet 1   Ascorbic Acid (VITAMIN C PO) Take 1 tablet by mouth daily.     cholecalciferol (VITAMIN D) 1000 units tablet Take 1 tablet (1,000 Units total) by mouth daily.     latanoprost (XALATAN) 0.005  % ophthalmic solution 1 drop at bedtime.     levothyroxine (SYNTHROID) 88 MCG tablet Take 1 tablet (88 mcg total) by mouth daily. 30 tablet 1   metoprolol succinate (TOPROL XL) 25 MG 24 hr tablet Take 0.5 tablets (12.5 mg total) by mouth at bedtime. 30 tablet 6   omeprazole (PRILOSEC) 40 MG capsule Take 40 mg by mouth as needed (stomach upset).     predniSONE (DELTASONE) 10 MG tablet Take 5 mg by mouth in the morning and at bedtime.     sucralfate (CARAFATE) 1 g tablet 1 tablet on an empty stomach Orally Twice a day as needed     No current facility-administered medications for this encounter.    No Known Allergies  Social History   Socioeconomic History   Marital status: Married    Spouse name: mun sic cho   Number of children: 1   Years of education: Not on file   Highest education level: Not on file  Occupational History   Not on file  Tobacco Use   Smoking status: Never   Smokeless tobacco: Never  Vaping Use   Vaping Use: Not on file  Substance and Sexual Activity   Alcohol use: No   Drug use: No   Sexual activity: Yes    Partners: Female  Other Topics Concern   Not on file  Social History Narrative   Not on file   Social Determinants of Health   Financial Resource Strain: Not on file  Food Insecurity: No Food Insecurity (04/21/2022)   Hunger Vital Sign    Worried About Running Out of Food in the Last Year: Never true    Ran Out of Food in the Last Year: Never true  Transportation Needs: No Transportation Needs (04/21/2022)   PRAPARE - Administrator, Civil Service (Medical): No    Lack of Transportation (Non-Medical): No  Physical Activity: Not on file  Stress: Not on file  Social Connections: Not on file  Intimate Partner Violence: Not on file    Family History  Problem Relation Age of Onset   Heart disease Neg Hx    Stroke Neg Hx    Diabetes Neg Hx     ROS- All systems are reviewed and negative except as per the HPI above  Physical  Exam: Vitals:   12/05/22 1117  BP: 124/84  Pulse: 61  Weight: 57.2 kg  Height: 5\' 4"  (1.626 m)   Wt Readings from Last 3 Encounters:  12/05/22 57.2 kg  06/08/22 59.1 kg  05/08/22 60.1 kg    Labs: Lab Results  Component Value Date   NA 137 06/08/2022   K 3.9 06/08/2022   CL 104 06/08/2022   CO2 27 06/08/2022   GLUCOSE 92 06/08/2022   BUN 22 06/08/2022   CREATININE 0.69 06/08/2022   CALCIUM 8.7 (L) 06/08/2022   MG 2.4 04/21/2022  No results found for: "INR" Lab Results  Component Value Date   CHOL 203 (H) 07/11/2019   HDL 52.10 07/11/2019   LDLCALC 120 (H) 07/11/2019   TRIG 159.0 (H) 07/11/2019    GEN- The patient is a well appearing female, alert and oriented x 3 today.   HEENT-head normocephalic, atraumatic, sclera clear, conjunctiva pink, hearing intact, trachea midline. Lungs- Clear to ausculation bilaterally, normal work of breathing Heart- Regular rate and rhythm, no murmurs, rubs or gallops  GI- soft, NT, ND, + BS Extremities- no clubbing, cyanosis, or edema MS- no significant deformity or atrophy Skin- no rash or lesion Psych- euthymic mood, full affect Neuro- strength and sensation are intact   ECG today demonstrates SR, NST Vent. rate 61 BPM PR interval 154 ms QRS duration 68 ms QT/QTcB 384/386 ms   Echo 04/21/22 1. Left ventricular ejection fraction, by estimation, is 60 to 65%. The  left ventricle has normal function. The left ventricle has no regional  wall motion abnormalities. Left ventricular diastolic parameters are  indeterminate.   2. Right ventricular systolic function is normal. The right ventricular  size is normal.   3. The mitral valve is normal in structure. No evidence of mitral valve  regurgitation. No evidence of mitral stenosis.   4. The aortic valve was not well visualized. There is moderate  calcification of the aortic valve. There is moderate thickening of the  aortic valve. Aortic valve regurgitation is trivial. Aortic  valve  sclerosis/calcification is present, without any  evidence of aortic stenosis.   5. The inferior vena cava is normal in size with greater than 50%  respiratory variability, suggesting right atrial pressure of 3 mmHg.     CHA2DS2-VASc Score = 3  The patient's score is based upon: CHF History: 0 HTN History: 0 Diabetes History: 0 Stroke History: 0 Vascular Disease History: 1 Age Score: 1 Gender Score: 1       ASSESSMENT AND PLAN: 1. Paroxysmal Atrial Fibrillation (ICD10:  I48.0) The patient's CHA2DS2-VASc score is 3, indicating a 3.2% annual risk of stroke.   Patient appears to be maintaining SR.  Has a apple watch for home monitoring  Continue Toprol 12.5 mg daily Continue Eliquis 5 mg BID. Would be OK from an afib standpoint to hold for pituitary surgery.   2. Secondary Hypercoagulable State (ICD10:  D68.69) The patient is at significant risk for stroke/thromboembolism based upon her CHA2DS2-VASc Score of 3.  Continue Apixaban (Eliquis).   3. CAD Coronary calcium test done in [place] in 2018 Calcium score of 14.5, plaque in LAD and RCA of 30% No anginal symptoms.   Follow up in the AF clinic in one year. Will refer to establish care with cardiology and for cardiac risk assessment for surgery.    Jorja Loa PA-C Af[facility] [address] [place], [place] [address] [phone]

## 2022-12-08 ENCOUNTER — Other Ambulatory Visit (HOSPITAL_COMMUNITY): Payer: Self-pay | Admitting: *Deleted

## 2022-12-08 MED ORDER — APIXABAN 5 MG PO TABS: 5.0000 mg | ORAL_TABLET | Freq: Two times a day (BID) | ORAL | 2 refills | Status: DC

## 2022-12-20 ENCOUNTER — Telehealth: Payer: Self-pay | Admitting: *Deleted

## 2022-12-20 NOTE — Telephone Encounter (Signed)
   Pre-operative Risk Assessment    Patient Name: [name]  DOB: 1956/04/20 MRN: [id number]      Request for Surgical Clearance    Procedure:   CRANIOTOMY TRANSPHENOIDAL RESECTION/PITUITARY TUMOR   Date of Surgery:  Clearance TBD                                 Surgeon:  DR. Rockne Menghini Surgeon's Group or Practice Name:  [facility] Phone number:  [phone] ATTN: CYNDY DUGGINS Fax number:  [phone]   Type of Clearance Requested:   - Medical  - Pharmacy:  Hold Apixaban (Eliquis)     Type of Anesthesia:  Not Indicated (GENERAL?)   Additional requests/questions:    [name]   12/20/2022, 10:22 AM

## 2022-12-20 NOTE — Telephone Encounter (Signed)
   Name: [name]  DOB: 25-Dec-1955  MRN: [id number]  Primary Cardiologist: None  Chart reviewed as part of pre-operative protocol coverage. Because of [name]'s past medical history and time since last visit, she will require a follow-up in-office visit in order to better assess preoperative cardiovascular risk.  Patient is currently followed by AF clinic and is not established with general cardiology and will require establishment for clearance.  Pre-op covering staff: - Please schedule appointment and call patient to inform them. If patient already had an upcoming appointment within acceptable timeframe, please add "pre-op clearance" to the appointment notes so provider is aware. - Please contact requesting surgeon's office via preferred method (i.e, phone, fax) to inform them of need for appointment prior to surgery.  This message will also be routed to pharmacy pool  for input on holding Eliquis as requested below so that this information is available to the clearing provider at time of patient's appointment.   Napoleon Form, Leodis Rains, NP  12/20/2022, 11:03 AM

## 2022-12-20 NOTE — Telephone Encounter (Signed)
Pt has NEW PT APPT WITH DR. BRANCH 01/09/23. I have added need pre op clearance to appt notes. I will update all parties involved.

## 2022-12-20 NOTE — Telephone Encounter (Signed)
Patient with diagnosis of atrial fibrilation on Eliquis for anticoagulation.    Procedure:   CRANIOTOMY TRANSPHENOIDAL RESECTION/PITUITARY TUMOR    Date of Surgery:  Clearance TBD   CHA2DS2-VASc Score = 3   This indicates a 3.2% annual risk of stroke. The patient's score is based upon: CHF History: 0 HTN History: 0 Diabetes History: 0 Stroke History: 0 Vascular Disease History: 1 Age Score: 1 Gender Score: 1   CrCl 70 Platelet count 204  Per office protocol, patient can hold Eliquis for 2-3 days prior to procedure.   Patient will not need bridging with Lovenox (enoxaparin) around procedure.  **This guidance is not considered finalized until pre-operative APP has relayed final recommendations.**

## 2023-01-08 NOTE — Progress Notes (Unsigned)
Cardiology Office Note:    Date:  01/09/2023   ID:  [name], DOB 21-Aug-1955, MRN [id number]  PCP:  Irena Reichmann, DO   [facility] Providers Cardiologist:  Maisie Fus, MD     Referring MD: Danice Goltz, [place]   No chief complaint on file. Pre-Op  History of Present Illness:    [name] is a 67 y.o. female with a hx of h/o  IBS,  hypothyroidism, HLD, CAD, afib , referral for FU. She was seen by afib clinic for afib with RVR in the ED was new onset in 2023. She was seen in mid May and was in SR. Continued on BB and eliquis. She was deemed acceptable risk from afib clinic for craniotomy transphenoidal resection/pituitary tumor. 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 She is here for an additional pre-op eval. No heart racing.  No prior cardiac disease hx She's active around the house. No CP or SOB. She can go up stairs without any issues.   Past Medical History:  Diagnosis Date   Helicobacter pylori gastritis 2001   IBS (irritable bowel syndrome)    Multinodular goiter 2001   Hurtle cell changes on FNA   Pituitary abnormality (HCC) 1990   Enlarged sella; Dr Newell Coral    Past Surgical History:  Procedure Laterality Date   APPENDECTOMY     COLONOSCOPY W/ POLYPECTOMY  2010   [facility] ( as per her son)but possibly in [place] 2011   THYROIDECTOMY  2002   Hurthle  cell changes on biopsy; TSH goal = < 0.3    Current Medications: Current Outpatient Medications on File Prior to Visit  Medication Sig Dispense Refill   alendronate (FOSAMAX) 70 MG tablet Take 70 mg by mouth once a week.     apixaban (ELIQUIS) 5 MG TABS tablet Take 1 tablet (5 mg total) by mouth 2 (two) times daily. 180 tablet 2   Ascorbic Acid (VITAMIN C PO) Take 1 tablet by mouth daily.     cholecalciferol (VITAMIN D) 1000 units tablet Take 1 tablet (1,000 Units total) by mouth daily.     latanoprost (XALATAN) 0.005 % ophthalmic solution  1 drop at bedtime.     levothyroxine (SYNTHROID) 88 MCG tablet Take 1 tablet (88 mcg total) by mouth daily. 30 tablet 1   metoprolol succinate (TOPROL XL) 25 MG 24 hr tablet Take 0.5 tablets (12.5 mg total) by mouth at bedtime. 30 tablet 6   omeprazole (PRILOSEC) 40 MG capsule Take 40 mg by mouth as needed (stomach upset).     predniSONE (DELTASONE) 10 MG tablet Take 5 mg by mouth in the morning and at bedtime.     sucralfate (CARAFATE) 1 g tablet 1 tablet on an empty stomach Orally Twice a day as needed     No current facility-administered medications on file prior to visit.      Allergies:   Patient has no known allergies.   Social History   Socioeconomic History   Marital status: Married    Spouse name: [name]   Number of children: 1   Years of education: Not on file   Highest education level: Not on file  Occupational History   Not on file  Tobacco Use   Smoking status: Never   Smokeless tobacco: Never  Vaping Use   Vaping Use: Not on file  Substance and Sexual Activity   Alcohol use: No   Drug  use: No   Sexual activity: Yes    Partners: Female  Other Topics Concern   Not on file  Social History Narrative   Not on file   Social Determinants of Health   Financial Resource Strain: Not on file  Food Insecurity: No Food Insecurity (04/21/2022)   Hunger Vital Sign    Worried About Running Out of Food in the Last Year: Never true    Ran Out of Food in the Last Year: Never true  Transportation Needs: No Transportation Needs (04/21/2022)   PRAPARE - Administrator, Civil Service (Medical): No    Lack of Transportation (Non-Medical): No  Physical Activity: Not on file  Stress: Not on file  Social Connections: Not on file     Family History: The patient's family history is negative for Heart disease, Stroke, and Diabetes.  ROS:   Please see the history of present illness.     All other systems reviewed and are negative.  EKGs/Labs/Other Studies  Reviewed:    The following studies were reviewed today:   EKG:  EKG is  ordered today.  The ekg ordered today demonstrates   01/09/2023- sinus bradycardia HR 57 bpm  Recent Labs: 04/21/2022: ALT 20; B Natriuretic Peptide 475.7; Magnesium 2.4; TSH 0.010 06/08/2022: BUN 22; Creatinine, Ser 0.69; Hemoglobin 14.5; Platelets 204; Potassium 3.9; Sodium 137  Recent Lipid Panel    Component Value Date/Time   CHOL 203 (H) 07/11/2019 0843   TRIG 159.0 (H) 07/11/2019 0843   HDL 52.10 07/11/2019 0843   CHOLHDL 4 07/11/2019 0843   VLDL 31.8 07/11/2019 0843   LDLCALC 120 (H) 07/11/2019 0843   LDLDIRECT 57.0 06/04/2018 1348     Risk Assessment/Calculations:    CHA2DS2-VASc Score = 3   This indicates a 3.2% annual risk of stroke. The patient's score is based upon: CHF History: 0 HTN History: 0 Diabetes History: 0 Stroke History: 0 Vascular Disease History: 1 Age Score: 1 Gender Score: 1               Physical Exam:    VS:   Vitals:   01/09/23 1032  BP: 124/82  Pulse: (!) 57  SpO2: 97%     Wt Readings from Last 3 Encounters:  01/09/23 127 lb 3.2 oz (57.7 kg)  12/05/22 126 lb (57.2 kg)  06/08/22 130 lb 6.4 oz (59.1 kg)     GEN:  Well nourished, well developed in no acute distress HEENT: Normal NECK: No JVD; No carotid bruits LYMPHATICS: No lymphadenopathy CARDIAC: RRR, no murmurs, rubs, gallops RESPIRATORY:  Clear to auscultation without rales, wheezing or rhonchi  ABDOMEN: Soft, non-tender, non-distended MUSCULOSKELETAL:  No edema; No deformity  SKIN: Warm and dry NEUROLOGIC:  Alert and oriented x 3 PSYCHIATRIC:  Normal affect   ASSESSMENT:   Pre-Op  She can walk > 4 METS without shortness of breath or chest pressure. She is low risk for intermediate risk surgery. She is acceptable cardiac risk for craniotomy transphenoidal resection/pituitary tumor. She can hold eliquis prior to her procedure for any duration of time, and restart when feasible.  Paroxysmal  Afib -continue BB - continue AC - she can continue with afib clinic  PLAN:    In order of problems listed above:  She is acceptable cardiac risk for craniotomy transphenoidal resection/pituitary tumor.           Medication Adjustments/Labs and Tests Ordered: Current medicines are reviewed at length with the patient today.  Concerns regarding medicines are  outlined above.  Orders Placed This Encounter  Procedures   EKG 12-Lead   No orders of the defined types were placed in this encounter.   Patient Instructions  Medication Instructions:  Your physician recommends that you continue on your current medications as directed. Please refer to the Current Medication list given to you today.  *If you need a refill on your cardiac medications before your next appointment, please call your pharmacy*   Lab Work: NONE If you have labs (blood work) drawn today and your tests are completely normal, you will receive your results only by: MyChart Message (if you have MyChart) OR A paper copy in the mail If you have any lab test that is abnormal or we need to change your treatment, we will call you to review the results.   Testing/Procedures: NONE   Follow-Up: At [facility], you and your health needs are our priority.  As part of our continuing mission to provide you with exceptional heart care, we have created designated Provider Care Teams.  These Care Teams include your primary Cardiologist (physician) and Advanced Practice Providers (APPs -  Physician Assistants and Nurse Practitioners) who all work together to provide you with the care you need, when you need it.  We recommend signing up for the patient portal called "MyChart".  Sign up information is provided on this After Visit Summary.  MyChart is used to connect with patients for Virtual Visits (Telemedicine).  Patients are able to view lab/test results, encounter notes, upcoming appointments, etc.  Non-urgent  messages can be sent to your provider as well.   To learn more about what you can do with MyChart, go to [email or web address].    Your next appointment:    AS NEEDED  Provider:   Dr. Carolan Clines, MD    Signed, Maisie Fus, MD  01/09/2023 10:59 AM    [facility]

## 2023-01-09 ENCOUNTER — Ambulatory Visit: Payer: Medicare Other | Attending: Internal Medicine | Admitting: Internal Medicine

## 2023-01-09 ENCOUNTER — Encounter: Payer: Self-pay | Admitting: Internal Medicine

## 2023-01-09 VITALS — BP 124/82 | HR 57 | Ht 63.0 in | Wt 127.2 lb

## 2023-01-09 DIAGNOSIS — I4891 Unspecified atrial fibrillation: Secondary | ICD-10-CM

## 2023-01-09 NOTE — Patient Instructions (Signed)
Medication Instructions:  Your physician recommends that you continue on your current medications as directed. Please refer to the Current Medication list given to you today.  *If you need a refill on your cardiac medications before your next appointment, please call your pharmacy*   Lab Work: NONE If you have labs (blood work) drawn today and your tests are completely normal, you will receive your results only by: MyChart Message (if you have MyChart) OR A paper copy in the mail If you have any lab test that is abnormal or we need to change your treatment, we will call you to review the results.   Testing/Procedures: NONE   Follow-Up: At [facility], you and your health needs are our priority.  As part of our continuing mission to provide you with exceptional heart care, we have created designated Provider Care Teams.  These Care Teams include your primary Cardiologist (physician) and Advanced Practice Providers (APPs -  Physician Assistants and Nurse Practitioners) who all work together to provide you with the care you need, when you need it.  We recommend signing up for the patient portal called "MyChart".  Sign up information is provided on this After Visit Summary.  MyChart is used to connect with patients for Virtual Visits (Telemedicine).  Patients are able to view lab/test results, encounter notes, upcoming appointments, etc.  Non-urgent messages can be sent to your provider as well.   To learn more about what you can do with MyChart, go to [email or web address].    Your next appointment:    AS NEEDED  Provider:   Dr. Carolan Clines, MD

## 2023-01-17 DIAGNOSIS — H401212 Low-tension glaucoma, right eye, moderate stage: Secondary | ICD-10-CM | POA: Diagnosis not present

## 2023-01-17 DIAGNOSIS — H401221 Low-tension glaucoma, left eye, mild stage: Secondary | ICD-10-CM | POA: Diagnosis not present

## 2023-02-14 DIAGNOSIS — R946 Abnormal results of thyroid function studies: Secondary | ICD-10-CM | POA: Diagnosis not present

## 2023-02-14 DIAGNOSIS — Z13220 Encounter for screening for lipoid disorders: Secondary | ICD-10-CM | POA: Diagnosis not present

## 2023-02-14 DIAGNOSIS — R7303 Prediabetes: Secondary | ICD-10-CM | POA: Diagnosis not present

## 2023-02-14 DIAGNOSIS — Z79899 Other long term (current) drug therapy: Secondary | ICD-10-CM | POA: Diagnosis not present

## 2023-02-21 DIAGNOSIS — E237 Disorder of pituitary gland, unspecified: Secondary | ICD-10-CM | POA: Diagnosis not present

## 2023-02-21 DIAGNOSIS — E890 Postprocedural hypothyroidism: Secondary | ICD-10-CM | POA: Diagnosis not present

## 2023-02-21 DIAGNOSIS — Z8739 Personal history of other diseases of the musculoskeletal system and connective tissue: Secondary | ICD-10-CM | POA: Diagnosis not present

## 2023-02-21 DIAGNOSIS — R7303 Prediabetes: Secondary | ICD-10-CM | POA: Diagnosis not present

## 2023-02-21 DIAGNOSIS — E2740 Unspecified adrenocortical insufficiency: Secondary | ICD-10-CM | POA: Diagnosis not present

## 2023-02-21 DIAGNOSIS — M79641 Pain in right hand: Secondary | ICD-10-CM | POA: Diagnosis not present

## 2023-02-21 DIAGNOSIS — E230 Hypopituitarism: Secondary | ICD-10-CM | POA: Diagnosis not present

## 2023-02-21 DIAGNOSIS — Z0000 Encounter for general adult medical examination without abnormal findings: Secondary | ICD-10-CM | POA: Diagnosis not present

## 2023-03-05 DIAGNOSIS — K219 Gastro-esophageal reflux disease without esophagitis: Secondary | ICD-10-CM | POA: Diagnosis not present

## 2023-03-05 DIAGNOSIS — Z01812 Encounter for preprocedural laboratory examination: Secondary | ICD-10-CM | POA: Diagnosis not present

## 2023-03-05 DIAGNOSIS — Z01818 Encounter for other preprocedural examination: Secondary | ICD-10-CM | POA: Diagnosis not present

## 2023-03-05 DIAGNOSIS — D352 Benign neoplasm of pituitary gland: Secondary | ICD-10-CM | POA: Diagnosis not present

## 2023-03-05 DIAGNOSIS — D497 Neoplasm of unspecified behavior of endocrine glands and other parts of nervous system: Secondary | ICD-10-CM | POA: Diagnosis not present

## 2023-03-05 DIAGNOSIS — E230 Hypopituitarism: Secondary | ICD-10-CM | POA: Diagnosis not present

## 2023-03-05 DIAGNOSIS — I480 Paroxysmal atrial fibrillation: Secondary | ICD-10-CM | POA: Diagnosis not present

## 2023-03-05 DIAGNOSIS — E890 Postprocedural hypothyroidism: Secondary | ICD-10-CM | POA: Diagnosis not present

## 2023-03-05 NOTE — Progress Notes (Signed)
 Patient Name:  [name]   MRN:  [id number]      [facility] Surgery Navigation Preoperative Medicine Evaluation  Surgeon(s): Lin JONELLE Civatte, MD Juliene SHAUNNA Shams, MD  Procedure(s): CRANIOTOMY TRANSPHENOIDAL RESECTION/PITUITARY TUMOR  Surgery Date: 03/14/2023  Preop Diagnosis:  pituitary macroadenoma  PCP: Lonell JAYSON Collet, DO  Reason for Evaluation  Preoperative medical evaluation of Atrial Fibrillation, Hypothyroidism, GERD, Hypopituitarism   This patient was given a Green light to proceed.  Pending care coordination No  Surgeon notifications No  Referrals No  Record requests outstanding No    Assessment and Plan  1.  Preoperative evaluation: CBC today reveals a hemoglobin of 14.0. BMP reveals renal function with a BUN of 25 and creatinine of 0.60 and electrolytes Na 139 and K 3.8.   2. Atrial Fibrillation, paroxysmal No recent issues. Patient will stop her Eliquis  on 03/09/2023 per surgeon's instructions. Resume Eliquis  postoperatively when surgical team deems safe to do so.   3. Hypothyroidism Patient will continue with Levothyroxine  through surgery.   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 Consider stress dose IV steroids perioperatively.   5. GERD Patient will remain on Prilosec through surgery.     Perioperative Risk Assessment Revised Cardiac Risk Estimation: Low risk for a perioperative cardiovascular event. Clinical predictors of increased perioperative cardiovascular risk: None. Perioperative risk assessment.  Nonemergent, intermediate risk procedure.  RCRI criteria = 0.  RCRI class I correlates to perioperative risk of major cardiac event (MI, CHF, cardiac death) of 0.4%.  According to the August 2014 ACC/AHA Guideline on perioperative cardiovascular evaluation, she would be considered low risk  and requires no further testing as it would not likely change perioperative management.  [facility] DASI:  45.45 on  03/05/2023;  Mini-Cog Score:  8;  Suggest No Impairment Frailty Score:  ROBUST (0)   Anesthesia Complications: None No known family history of anesthesia complications   History of Present Illness This is a 67 y.o. female with a history of Atrial Fibrillation, Hypothyroidism, GERD, Hypopituitarism who presents to the preop clinic pending above noted surgery.  She has had symptoms of pituitary macroadenoma leading to this surgery being scheduled.  She has atrial fib which is managed by Cardiology. She was last seen on 01/09/2023 by [facility]. She was deemed acceptable cardiac risk for upcoming surgery. She is managed with a BB and Eliquis . Echo on 04/21/2022 showed LVEF 60-65%, no significant valvular abnormalities.   She has pituitary macroadenoma and panhypopituiarism which is managed by Endocrinology. Last OV 08/17/22.   She has post surgical hypothyroidism which is managed  with Levothyroxine .   Denies any chest pain, palpitations, orthopnea, dizziness, DOE, edema, or syncope.     Pertinent Negatives: angina/MI, CHF, valvular heart disease, arrhythmia, syncope, lung disease, CVA, bleeding or clotting disorder, diabetes, seizure disorder or kidney disease.    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  Allergies and Medications: No Known Allergies Current Outpatient Medications on File Prior to Visit  Medication Sig Dispense Refill   apixaban  (ELIQUIS ) 5 mg tablet Take one tablet (5 mg dose) by mouth 2 (two) times daily. Dr. Hillman would like you to STOP taking your Eliquis  on Friday, 03/09/2023     Brewers Yeast POWD Take 1 Scoop by mouth daily.     COLLAGEN PO 1 Dose daily. Powder     latanoprost  (XALATAN ) 0.005% ophthalmic solution Place one drop into both eyes.     levothyroxine  sodium (SYNTHROID ) 88 mcg tablet Take one tablet (88 mcg dose) by mouth daily. 90 tablet 3   omeprazole (PRILOSEC) 40 mg capsule Take one capsule (40 mg dose) by mouth daily.     predniSONE  (DELTASONE ) 5 mg tablet Take #1 tablet in the morning and #1/2 tablet in the evening.  Double dose when ill. 135 tablet 3   UNABLE TO FIND daily. Med Name: vitamin   d  1000mg   daily     UNABLE TO FIND daily. Med Name: centrum   multivitamin     UNABLE TO FIND 1 each daily. Med Name: phosphatidylserine 400 mg     vitamin C (ASCORBIC ACID) 500 mg tablet Take two tablets (1,000 mg dose) by mouth daily.     No current facility-administered medications on file prior to visit.    Review of Systems: Pertinent negatives include (i.e., patient denies): Excessive weight changes, skin abnormalities, chest pain, dyspnea on exertion, palpitations, worsening peripheral edema, orthopnea, syncope, fever, recent cough, abdominal pain, urinary frequency/urgency, or digestive complaints.   Pertinent positives include: pituitary macroadenoma   All others reviewed and negative.   Physical Examination BP 118/68 (BP Location: Right arm, Patient Position: Sitting)   Pulse 62   Temp 97.7 F (36.5 C) (Oral)   Resp 20   Ht 1.575 m (5' 2)   Wt 57.9 kg (127 lb 10.3 oz)   SpO2 97%   BMI 23.35 kg/m  Physical Exam General:The patient is well nourished and in no distress. HEENT:  Pupils equal round reactive light and extraocular movements intact. Mucous membranes moist. Head atraumatic.  Neck: Supple. No JVD.  Cardiovascular exam reveals RRR. No murmur. No rub. No gallop.  Lungs auscultation: Clear bilaterally.  Abdomen: Soft, nontender and with normal bowel sounds, no masses or hepatosplenomegaly.  Extremities: No edema. No cyanosis. Full ROM Pulses: 2+ and intact bilaterally. Skin reveals no rashes.  Neurological: reveals cranial nerves are intact with no focal deficits.  Psychiatric: reveals the patient is alert and oriented to person place and time. Skin:  normal, no cyanosis, jaundice, pallor or bruising  Other Studies   Pre-Admission Testing on 03/05/2023  Component Date Value Ref Range Status   Na 03/05/2023 139  136 - 146 mmol/L Final   Potassium 03/05/2023 3.8  3.7 - 5.4 mmol/L Final   Cl 03/05/2023 101  97 - 108 mmol/L Final   CO2 03/05/2023 26  20 - 32 mmol/L Final   AGAP 03/05/2023 12  7 - 16 mmol/L Final   Glucose 03/05/2023 104 (H)  65 - 99 mg/dL Final   BUN 91/87/7975 25  8 - 27 mg/dL Final  Creatinine 03/05/2023 0.60  0.57 - 1.00 mg/dL Final   Ca 91/87/7975 9.0  8.6 - 10.2 mg/dL Final   BUN/CREAT RATIO 03/05/2023 41.7 (H)  11.0 - 26.0 Final   eGFR 03/05/2023 98  mL/min/1.63m2 Final   WBC 03/05/2023 8.3  4.0 - 10.5 thou/mcL Final   RBC 03/05/2023 4.34  3.93 - 5.22 million/mcL Final   HGB 03/05/2023 14.0  11.2 - 15.7 gm/dL Final   HCT 91/87/7975 42.7  34.1 - 44.9 % Final   MCV 03/05/2023 98.4 (H)  79.4 - 94.8 fL Final   MCH 03/05/2023 32.3 (H)  25.6 - 32.2 pg Final   MCHC 03/05/2023 32.8  32.2 - 35.5 gm/dL Final   Plt Ct 91/87/7975 210  150 - 400 thou/mcL Final   RDW SD 03/05/2023 46.5 (H)  35.1 - 46.3 fL Final   MPV 03/05/2023 10.4  9.4 - 12.4 fL Final   NRBC% 03/05/2023 0.0  0.0 - 0.2 /100WBC Final   Absolute NRBC Count 03/05/2023 0.00  0.00 - 0.01 thou/mcL Final   NEUTROPHIL % 03/05/2023 73.6  % Final    LYMPHOCYTE % 03/05/2023 18.5  % Final   MONOCYTE % 03/05/2023 6.3  % Final   Eosinophil % 03/05/2023 0.7  % Final   BASOPHIL % 03/05/2023 0.5  % Final   IG% 03/05/2023 0.4  % Final   ABSOLUTE NEUTROPHIL COUNT 03/05/2023 6.10  1.50 - 7.50 thou/mcL Final   ABSOLUTE LYMPHOCYTE COUNT 03/05/2023 1.53  1.00 - 4.50 thou/mcL Final   Absolute Monocyte Count 03/05/2023 0.52  0.10 - 0.80 thou/mcL Final   Absolute Eosinophil Count 03/05/2023 0.06  0.00 - 0.50 thou/mcL Final   Absolute Basophil Count 03/05/2023 0.04  0.00 - 0.20 thou/mcL Final   Absolute Immature Granulocyte Count 03/05/2023 0.03  0.00 - 0.03 thou/mcL  Final   PTT 03/05/2023 24  22 - 35 second(s) Final   PT 03/05/2023 12.5  11.8 - 14.3 second(s) Final   INR 03/05/2023 0.9  See Therapeutic ranges Final     Marjorie JONETTA Erp, PA-C 03/06/2023 8:21 AM [facility] Surgery Navigation  CC: Lonell JAYSON Collet, DO, [place], [place], *

## 2023-03-06 DIAGNOSIS — Z1231 Encounter for screening mammogram for malignant neoplasm of breast: Secondary | ICD-10-CM | POA: Diagnosis not present

## 2023-03-12 DIAGNOSIS — D352 Benign neoplasm of pituitary gland: Secondary | ICD-10-CM | POA: Diagnosis not present

## 2023-03-14 DIAGNOSIS — Z79899 Other long term (current) drug therapy: Secondary | ICD-10-CM | POA: Diagnosis not present

## 2023-03-14 DIAGNOSIS — E236 Other disorders of pituitary gland: Secondary | ICD-10-CM | POA: Diagnosis not present

## 2023-03-14 DIAGNOSIS — I2510 Atherosclerotic heart disease of native coronary artery without angina pectoris: Secondary | ICD-10-CM | POA: Diagnosis not present

## 2023-03-14 DIAGNOSIS — Z7901 Long term (current) use of anticoagulants: Secondary | ICD-10-CM | POA: Diagnosis not present

## 2023-03-14 DIAGNOSIS — E230 Hypopituitarism: Secondary | ICD-10-CM | POA: Diagnosis not present

## 2023-03-14 DIAGNOSIS — D352 Benign neoplasm of pituitary gland: Secondary | ICD-10-CM | POA: Diagnosis not present

## 2023-03-15 DIAGNOSIS — E236 Other disorders of pituitary gland: Secondary | ICD-10-CM | POA: Diagnosis not present

## 2023-03-19 DIAGNOSIS — C7A1 Malignant poorly differentiated neuroendocrine tumors: Secondary | ICD-10-CM | POA: Diagnosis not present

## 2023-03-19 DIAGNOSIS — D352 Benign neoplasm of pituitary gland: Secondary | ICD-10-CM | POA: Diagnosis not present

## 2023-03-22 DIAGNOSIS — J322 Chronic ethmoidal sinusitis: Secondary | ICD-10-CM | POA: Diagnosis not present

## 2023-03-28 ENCOUNTER — Telehealth: Payer: Self-pay | Admitting: *Deleted

## 2023-03-28 DIAGNOSIS — K219 Gastro-esophageal reflux disease without esophagitis: Secondary | ICD-10-CM | POA: Diagnosis not present

## 2023-03-28 DIAGNOSIS — I482 Chronic atrial fibrillation, unspecified: Secondary | ICD-10-CM | POA: Diagnosis not present

## 2023-03-28 DIAGNOSIS — K2970 Gastritis, unspecified, without bleeding: Secondary | ICD-10-CM | POA: Diagnosis not present

## 2023-03-28 NOTE — Telephone Encounter (Signed)
   Pre-operative Risk Assessment    Patient Name: [name]  DOB: May 10, 1956 MRN: [id number]   DATE OF LAST VISIT: 01/09/23 DR. BRANCH DATE OF NEXT VISIT: NONE  Request for Surgical Clearance    Procedure:   EGD  Date of Surgery:  Clearance 05/01/23                                 Surgeon:  DR. HUNG Surgeon's Group or Practice Name:  [facility] Phone number:  [phone] Fax number:  [phone]   Type of Clearance Requested:   - Medical  - Pharmacy:  Hold Apixaban (Eliquis)     Type of Anesthesia:   PROPOFOL   Additional requests/questions:    Elpidio Anis   03/28/2023, 4:44 PM

## 2023-03-29 NOTE — Telephone Encounter (Signed)
   Name: [name]  DOB: 1956-04-26  MRN: [id number]  Primary Cardiologist: Maisie Fus, MD   Preoperative team, please contact this patient and set up a phone call appointment for further preoperative risk assessment. Please obtain consent and complete medication review. Thank you for your help.  I confirm that guidance regarding antiplatelet and oral anticoagulation therapy has been completed and, if necessary, noted below.  Patient with diagnosis of afib on Eliquis for anticoagulation.     Procedure: EGD Date of procedure: 05/01/23   CHA2DS2-VASc Score = 3  This indicates a 3.2% annual risk of stroke. The patient's score is based upon: CHF History: 0 HTN History: 0 Diabetes History: 0 Stroke History: 0 Vascular Disease History: 1 Age Score: 1 Gender Score: 1   CrCl 29mL/min Platelet count 210K   Per office protocol, patient can hold Eliquis for 1-2 days prior to procedure.     **This guidance is not considered finalized until pre-operative APP has relayed final recommendations.Joni Reining, NP 03/29/2023, 8:07 AM [facility]

## 2023-03-29 NOTE — Telephone Encounter (Signed)
Patient with diagnosis of afib on Eliquis for anticoagulation.    Procedure: EGD Date of procedure: 05/01/23  CHA2DS2-VASc Score = 3  This indicates a 3.2% annual risk of stroke. The patient's score is based upon: CHF History: 0 HTN History: 0 Diabetes History: 0 Stroke History: 0 Vascular Disease History: 1 Age Score: 1 Gender Score: 1  CrCl 7mL/min Platelet count 210K  Per office protocol, patient can hold Eliquis for 1-2 days prior to procedure.    **This guidance is not considered finalized until pre-operative APP has relayed final recommendations.**

## 2023-03-30 NOTE — Telephone Encounter (Signed)
1st attempt at scheduling tele preop. [email or web address]

## 2023-03-30 NOTE — Telephone Encounter (Signed)
Routed incorrectly. Sent to Pre-Op Call back  KL

## 2023-04-02 ENCOUNTER — Telehealth: Payer: Self-pay

## 2023-04-02 NOTE — Telephone Encounter (Signed)
Pt is scheduled 04/17/23 at 10 am. Med rec and consent done.

## 2023-04-02 NOTE — Telephone Encounter (Signed)
Pt is scheduled 04/17/23 at 10 am. Med rec and consent done.    Patient Consent for Virtual Visit        Jamell Koopman has provided verbal consent on 04/02/2023 for a virtual visit (video or telephone).   CONSENT FOR VIRTUAL VISIT FOR:  Manson Passey  By participating in this virtual visit I agree to the following:  I hereby voluntarily request, consent and authorize [facility] and its employed or contracted physicians, physician assistants, nurse practitioners or other licensed health care professionals (the Practitioner), to provide me with telemedicine health care services (the "Services") as deemed necessary by the treating Practitioner. I acknowledge and consent to receive the Services by the Practitioner via telemedicine. I understand that the telemedicine visit will involve communicating with the Practitioner through live audiovisual communication technology and the disclosure of certain medical information by electronic transmission. I acknowledge that I have been given the opportunity to request an in-person assessment or other available alternative prior to the telemedicine visit and am voluntarily participating in the telemedicine visit.  I understand that I have the right to withhold or withdraw my consent to the use of telemedicine in the course of my care at any time, without affecting my right to future care or treatment, and that the Practitioner or I may terminate the telemedicine visit at any time. I understand that I have the right to inspect all information obtained and/or recorded in the course of the telemedicine visit and may receive copies of available information for a reasonable fee.  I understand that some of the potential risks of receiving the Services via telemedicine include:  Delay or interruption in medical evaluation due to technological equipment failure or disruption; Information transmitted may not be sufficient (e.g. poor resolution of images) to  allow for appropriate medical decision making by the Practitioner; and/or  In rare instances, security protocols could fail, causing a breach of personal health information.  Furthermore, I acknowledge that it is my responsibility to provide information about my medical history, conditions and care that is complete and accurate to the best of my ability. I acknowledge that Practitioner's advice, recommendations, and/or decision may be based on factors not within their control, such as incomplete or inaccurate data provided by me or distortions of diagnostic images or specimens that may result from electronic transmissions. I understand that the practice of medicine is not an exact science and that Practitioner makes no warranties or guarantees regarding treatment outcomes. I acknowledge that a copy of this consent can be made available to me via my patient portal [facility] MyChart), or I can request a printed copy by calling the office of [facility].    I understand that my insurance will be billed for this visit.   I have read or had this consent read to me. I understand the contents of this consent, which adequately explains the benefits and risks of the Services being provided via telemedicine.  I have been provided ample opportunity to ask questions regarding this consent and the Services and have had my questions answered to my satisfaction. I give my informed consent for the services to be provided through the use of telemedicine in my medical care

## 2023-04-17 ENCOUNTER — Ambulatory Visit: Payer: Medicare Other | Attending: Cardiovascular Disease | Admitting: Student

## 2023-04-17 DIAGNOSIS — Z01810 Encounter for preprocedural cardiovascular examination: Secondary | ICD-10-CM

## 2023-04-17 NOTE — Progress Notes (Signed)
Virtual Visit via Telephone Note   Because of [name]'s co-morbid illnesses, she is at least at moderate risk for complications without adequate follow up.  This format is felt to be most appropriate for this patient at this time.  The patient did not have access to video technology/had technical difficulties with video requiring transitioning to audio format only (telephone).  All issues noted in this document were discussed and addressed.  No physical exam could be performed with this format.  Please refer to the patient's chart for her consent to telehealth for [facility].  Evaluation Performed:  Preoperative cardiovascular risk assessment _____________   Date:  04/17/2023   Patient ID:  [name], DOB Dec 01, 1955, MRN [id number] Patient Location:  Home Provider location:   Office  Primary Care Provider:  Irena Reichmann, DO Primary Cardiologist:  Maisie Fus, MD  Chief Complaint / Patient Profile   67 y.o. y/o female with a h/o CAD, PAF on anticoagulation, hyperlipidemia, hypothyroidism who is pending EGD by Dr. Elnoria Howard on 05/01/2023 and presents today for telephonic preoperative cardiovascular risk assessment.  History of Present Illness    [name] is a 67 y.o. female who presents via audio/video conferencing for a telehealth visit today. Her son is present and assists with translation.  Pt was last seen in cardiology clinic on 01/09/2023 by Dr. Wyline Mood.  At that time [name] was stable from a cardiac standpoint.  The patient is now pending procedure as outlined above. Since her last visit, she is doing well. Patient denies shortness of breath, dyspnea on exertion, lower extremity edema, orthopnea or PND. No chest pain, pressure, or tightness. No palpitations. No lightheadedness, dizziness, presyncope or syncope. She is very active around her home walking, doing yard work and performing light to moderate household activities.   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 Authorizing Provider  alendronate (FOSAMAX) 70 MG tablet Take 70 mg by mouth once a week. 05/04/22   [provider]  apixaban (ELIQUIS) 5 MG TABS tablet Take 1 tablet (5 mg total) by mouth 2 (two) times daily. 12/08/22   Fenton, Clint R, PA  Ascorbic Acid (VITAMIN C PO) Take 1 tablet by mouth daily.    [provider]  cholecalciferol (VITAMIN D) 1000 units tablet Take 1 tablet (1,000 Units total) by mouth daily. 12/09/15   Burns, Bobette Mo, MD  latanoprost (XALATAN) 0.005 % ophthalmic solution 1 drop at bedtime. 03/31/22   [provider]  levothyroxine (SYNTHROID) 88 MCG tablet Take 1 tablet (88 mcg total) by mouth daily. 04/22/22 04/22/23  Leatha Gilding, MD  metoprolol succinate (TOPROL XL) 25 MG 24 hr tablet Take 0.5 tablets (12.5 mg total) by mouth at bedtime. 05/08/22 05/08/23  Newman Nip, NP  omeprazole (PRILOSEC) 40 MG capsule Take 40 mg by mouth as needed (stomach upset). 02/20/22   [provider]  predniSONE (DELTASONE) 10 MG tablet Take 5 mg by mouth in the morning and at bedtime. 03/06/22   [provider]  sucralfate (CARAFATE) 1 g tablet 1 tablet on an empty stomach Orally Twice a day as needed    [provider]    Physical Exam    Vital Signs:  [name] does not have vital signs available for review  today.  Given telephonic nature of communication, physical exam is limited. AAOx3. NAD. Normal affect.  Speech and respirations are unlabored.  Accessory Clinical Findings    None  Assessment & Plan    Primary Cardiologist: Maisie Fus, MD  Preoperative cardiovascular risk assessment. EGD with Dr. Elnoria Howard on 05/01/2023.  Chart reviewed as part of pre-operative protocol coverage. According to the RCRI, patient has a 0.4% risk of MACE. Patient reports activity equivalent to >4.0 METS (walking, yard work, other household activities).   Given past medical history and time since last visit, based on ACC/AHA guidelines, [name] would be at acceptable risk for the planned procedure without further cardiovascular testing.   Patient was advised that if she develops new symptoms prior to surgery to contact our office to arrange a follow-up appointment.  she verbalized understanding.  Per Pharm D, patient may hold Eliquis for 1-2 days prior to procedure.    I will route this recommendation to the requesting party via Epic fax function.  Please call with questions.  Time:   Today, I have spent 5 minutes with the patient with telehealth technology discussing medical history, symptoms, and management plan.     Carlos Levering, NP  04/17/2023, 8:03 AM

## 2023-04-18 DIAGNOSIS — D352 Benign neoplasm of pituitary gland: Secondary | ICD-10-CM | POA: Diagnosis not present

## 2023-04-18 DIAGNOSIS — E230 Hypopituitarism: Secondary | ICD-10-CM | POA: Diagnosis not present

## 2023-04-18 DIAGNOSIS — D497 Neoplasm of unspecified behavior of endocrine glands and other parts of nervous system: Secondary | ICD-10-CM | POA: Diagnosis not present

## 2023-04-18 DIAGNOSIS — E890 Postprocedural hypothyroidism: Secondary | ICD-10-CM | POA: Diagnosis not present

## 2023-05-01 DIAGNOSIS — K219 Gastro-esophageal reflux disease without esophagitis: Secondary | ICD-10-CM | POA: Diagnosis not present

## 2023-05-01 DIAGNOSIS — K31A12 Gastric intestinal metaplasia without dysplasia, involving the body (corpus): Secondary | ICD-10-CM | POA: Diagnosis not present

## 2023-05-01 DIAGNOSIS — K2970 Gastritis, unspecified, without bleeding: Secondary | ICD-10-CM | POA: Diagnosis not present

## 2023-05-01 DIAGNOSIS — K31A11 Gastric intestinal metaplasia without dysplasia, involving the antrum: Secondary | ICD-10-CM | POA: Diagnosis not present

## 2023-05-01 DIAGNOSIS — K31A15 Gastric intestinal metaplasia without dysplasia, involving multiple sites: Secondary | ICD-10-CM | POA: Diagnosis not present

## 2023-05-01 DIAGNOSIS — K2950 Unspecified chronic gastritis without bleeding: Secondary | ICD-10-CM | POA: Diagnosis not present

## 2023-05-01 DIAGNOSIS — K3189 Other diseases of stomach and duodenum: Secondary | ICD-10-CM | POA: Diagnosis not present

## 2023-05-10 DIAGNOSIS — D352 Benign neoplasm of pituitary gland: Secondary | ICD-10-CM | POA: Diagnosis not present

## 2023-05-25 DIAGNOSIS — H2513 Age-related nuclear cataract, bilateral: Secondary | ICD-10-CM | POA: Diagnosis not present

## 2023-05-25 DIAGNOSIS — H02831 Dermatochalasis of right upper eyelid: Secondary | ICD-10-CM | POA: Diagnosis not present

## 2023-05-25 DIAGNOSIS — H401221 Low-tension glaucoma, left eye, mild stage: Secondary | ICD-10-CM | POA: Diagnosis not present

## 2023-05-25 DIAGNOSIS — H401212 Low-tension glaucoma, right eye, moderate stage: Secondary | ICD-10-CM | POA: Diagnosis not present

## 2023-06-11 DIAGNOSIS — H1131 Conjunctival hemorrhage, right eye: Secondary | ICD-10-CM | POA: Diagnosis not present

## 2023-06-11 DIAGNOSIS — H02052 Trichiasis without entropian right lower eyelid: Secondary | ICD-10-CM | POA: Diagnosis not present

## 2023-07-09 DIAGNOSIS — D352 Benign neoplasm of pituitary gland: Secondary | ICD-10-CM | POA: Diagnosis not present

## 2023-07-26 DIAGNOSIS — H401221 Low-tension glaucoma, left eye, mild stage: Secondary | ICD-10-CM | POA: Diagnosis not present

## 2023-07-26 DIAGNOSIS — H401212 Low-tension glaucoma, right eye, moderate stage: Secondary | ICD-10-CM | POA: Diagnosis not present

## 2023-08-17 DIAGNOSIS — E7800 Pure hypercholesterolemia, unspecified: Secondary | ICD-10-CM | POA: Diagnosis not present

## 2023-08-17 DIAGNOSIS — R7303 Prediabetes: Secondary | ICD-10-CM | POA: Diagnosis not present

## 2023-08-17 DIAGNOSIS — K2970 Gastritis, unspecified, without bleeding: Secondary | ICD-10-CM | POA: Diagnosis not present

## 2023-08-31 DIAGNOSIS — I4891 Unspecified atrial fibrillation: Secondary | ICD-10-CM | POA: Diagnosis not present

## 2023-08-31 DIAGNOSIS — E2740 Unspecified adrenocortical insufficiency: Secondary | ICD-10-CM | POA: Diagnosis not present

## 2023-08-31 DIAGNOSIS — Z7901 Long term (current) use of anticoagulants: Secondary | ICD-10-CM | POA: Diagnosis not present

## 2023-08-31 DIAGNOSIS — K2970 Gastritis, unspecified, without bleeding: Secondary | ICD-10-CM | POA: Diagnosis not present

## 2023-08-31 DIAGNOSIS — Z79899 Other long term (current) drug therapy: Secondary | ICD-10-CM | POA: Diagnosis not present

## 2023-08-31 DIAGNOSIS — E237 Disorder of pituitary gland, unspecified: Secondary | ICD-10-CM | POA: Diagnosis not present

## 2023-08-31 DIAGNOSIS — R7303 Prediabetes: Secondary | ICD-10-CM | POA: Diagnosis not present

## 2023-08-31 DIAGNOSIS — E7800 Pure hypercholesterolemia, unspecified: Secondary | ICD-10-CM | POA: Diagnosis not present

## 2023-08-31 DIAGNOSIS — E230 Hypopituitarism: Secondary | ICD-10-CM | POA: Diagnosis not present

## 2023-08-31 DIAGNOSIS — E890 Postprocedural hypothyroidism: Secondary | ICD-10-CM | POA: Diagnosis not present

## 2023-09-13 ENCOUNTER — Other Ambulatory Visit (HOSPITAL_COMMUNITY): Payer: Self-pay

## 2023-09-13 MED ORDER — APIXABAN 5 MG PO TABS: 5.0000 mg | ORAL_TABLET | Freq: Two times a day (BID) | ORAL | 0 refills | Status: DC

## 2023-10-16 DIAGNOSIS — E230 Hypopituitarism: Secondary | ICD-10-CM | POA: Diagnosis not present

## 2023-10-16 DIAGNOSIS — E890 Postprocedural hypothyroidism: Secondary | ICD-10-CM | POA: Diagnosis not present

## 2023-10-16 DIAGNOSIS — D497 Neoplasm of unspecified behavior of endocrine glands and other parts of nervous system: Secondary | ICD-10-CM | POA: Diagnosis not present

## 2023-10-16 DIAGNOSIS — D352 Benign neoplasm of pituitary gland: Secondary | ICD-10-CM | POA: Diagnosis not present

## 2023-12-03 DIAGNOSIS — H2513 Age-related nuclear cataract, bilateral: Secondary | ICD-10-CM | POA: Diagnosis not present

## 2023-12-03 DIAGNOSIS — H43813 Vitreous degeneration, bilateral: Secondary | ICD-10-CM | POA: Diagnosis not present

## 2023-12-03 DIAGNOSIS — H401221 Low-tension glaucoma, left eye, mild stage: Secondary | ICD-10-CM | POA: Diagnosis not present

## 2023-12-03 DIAGNOSIS — H401212 Low-tension glaucoma, right eye, moderate stage: Secondary | ICD-10-CM | POA: Diagnosis not present

## 2023-12-06 ENCOUNTER — Ambulatory Visit (HOSPITAL_COMMUNITY)
Admission: RE | Admit: 2023-12-06 | Discharge: 2023-12-06 | Disposition: A | Discharge status: Home / Self Care | Payer: Medicare Other | Source: Ambulatory Visit | Attending: Physician Assistant | Admitting: Physician Assistant

## 2023-12-06 ENCOUNTER — Encounter (HOSPITAL_COMMUNITY): Payer: Self-pay | Admitting: Physician Assistant

## 2023-12-06 VITALS — BP 130/90 | HR 57 | Ht 63.0 in | Wt 123.8 lb

## 2023-12-06 DIAGNOSIS — I480 Paroxysmal atrial fibrillation: Secondary | ICD-10-CM

## 2023-12-06 DIAGNOSIS — D6869 Other thrombophilia: Secondary | ICD-10-CM

## 2023-12-06 MED ORDER — APIXABAN 5 MG PO TABS: 5.0000 mg | ORAL_TABLET | Freq: Two times a day (BID) | ORAL | 3 refills | Status: AC

## 2023-12-06 MED ORDER — METOPROLOL SUCCINATE ER 25 MG PO TB24: 12.5000 mg | ORAL_TABLET | Freq: Every day | ORAL | 3 refills | Status: AC

## 2023-12-06 NOTE — Progress Notes (Signed)
 Primary Care Physician: Pete Brand, DO Referring Physician: [facility] admission  f/u Primary Cardiologist: Dr Alois Arnt   [name] is a 68 y.o. female with a h/o  IBS,  hypothyroidism, HLD, CAD, enlarged pituitary sella who presented to ED with complaints of chest pain. She was seen at urgent care and found to be in afib with RVR and sent to ED. Her chest started to hurt early this AM and was intermittent in nature. Her watch also alerted her as well. Pain substernal, no radiation. Described as squeezing. Pain was severe enough she couldn't sleep. She had associated nausea and shortness of breath. She has no chest pain at this time. She has not had palpitations. She does not drink any caffeine or energy drinks. Some stress at home. No recent illness. History of heart disease in her mother. He has been feeling good. Denies any fever/chills, palpitations, shortness of breath or cough, abdominal pain, N/V/D, dysuria or leg swelling. She does not smoke or drink. she is here today with interpretor and son. She had her husband live locally.   She was found to be in afib at 128 bpm. She was placed on diltiazem  drip initially, also received metoprolol  and converted to sinus rhythm.  Admitting MD discussed with the patient regarding anticoagulation, she was started on Eliquis .   She apparently had a coronary calcium  test done in [place] in 2018, calcium  score of 14.5, plaque in LAD and RCA of 30%. With improvement in her rate and rhythm, chest pain  resolved.  She is active at baseline and does not have any anginal symptoms.  Troponin was mildly elevated, downtrending, overall flat consistent with demand ischemia in the setting of A-fib with RVR rather than ACS.  2D echo was done which showed an EF of 60-65%, no wall motion abnormalities, RV was normal. Troponin elevation was thought to be demand ischemia.  She has a h/o thyroidectomy for Hurthle cell changes, with a goal TSH of less than 0.3.  Her TSH now  is 0.01, quite suppressed, potentially causing her A-fib with RVR.  She has a follow-up with her endocrinologist in a month,and her Synthroid  was decreased  to 88 mcg from 100. She has an outpatient  f/u with neurosurgery for her pituitary tumor.    F/u in afib clinic, 06/07/22. No further afib to report. She is with interpretor and her son. Her son states that she may be pending surgery for  her pituitary tumor in the near future. She has not had any issues with anticoagulation.  Follow up in the AF clinic 12/05/22. An interpreter was used for today's visit. Patient reports that she has done well since her last visit. She has not had any known episodes of afib. No bleeding issues on anticoagulation. She is followed by neurosurgery for a pituitary tumor.   Follow up 12/06/23. Patient returns for follow up for atrial fibrillation. Patient's son served as interpreter for today's visit. She reports that she has done well from an afib standpoint. She remains in SR. Her smart watch has not shown any afib alerts. No bleeding issues on anticoagulation.   Today, she  denies symptoms of palpitations, chest pain, shortness of breath, orthopnea, PND, lower extremity edema, dizziness, presyncope, syncope, snoring, daytime somnolence, bleeding, or neurologic sequela. The patient is tolerating medications without difficulties and is otherwise without complaint today.    Past Medical History:  Diagnosis Date   Helicobacter pylori gastritis 2001   IBS (irritable bowel syndrome)  Multinodular goiter 2001   Hurtle cell changes on FNA   Pituitary abnormality (HCC) 1990   Enlarged sella; Dr Cipriano Creeks     Current Outpatient Medications  Medication Sig Dispense Refill   alendronate (FOSAMAX) 70 MG tablet Take 70 mg by mouth once a week.     apixaban  (ELIQUIS ) 5 MG TABS tablet Take 1 tablet (5 mg total) by mouth 2 (two) times daily. 180 tablet 0   Ascorbic Acid (VITAMIN C PO) Take 1 tablet by mouth daily.      cholecalciferol (VITAMIN D ) 1000 units tablet Take 1 tablet (1,000 Units total) by mouth daily.     latanoprost  (XALATAN ) 0.005 % ophthalmic solution 1 drop at bedtime.     levothyroxine  (SYNTHROID ) 88 MCG tablet Take 1 tablet (88 mcg total) by mouth daily. 30 tablet 1   metoprolol  succinate (TOPROL  XL) 25 MG 24 hr tablet Take 0.5 tablets (12.5 mg total) by mouth at bedtime. 30 tablet 6   omeprazole (PRILOSEC) 40 MG capsule Take 40 mg by mouth as needed (stomach upset).     predniSONE  (DELTASONE ) 10 MG tablet Take 5 mg by mouth in the morning and at bedtime.     sucralfate (CARAFATE) 1 g tablet 1 tablet on an empty stomach Orally Twice a day as needed     No current facility-administered medications for this encounter.    ROS- All systems are reviewed and negative except as per the HPI above  Physical Exam: Vitals:   12/06/23 0957  BP: (!) 130/90  Pulse: (!) 57  Weight: 56.2 kg  Height: 5\' 3"  (1.6 m)    Wt Readings from Last 3 Encounters:  12/06/23 56.2 kg  01/09/23 57.7 kg  12/05/22 57.2 kg    GEN: Well nourished, well developed in no acute distress CARDIAC: Regular rate and rhythm, no murmurs, rubs, gallops RESPIRATORY:  Clear to auscultation without rales, wheezing or rhonchi  ABDOMEN: Soft, non-tender, non-distended EXTREMITIES:  No edema; No deformity    ECG today demonstrates SB Vent. rate 57 BPM PR interval 154 ms QRS duration 68 ms QT/QTcB 426/414 ms   Echo 04/21/22 1. Left ventricular ejection fraction, by estimation, is 60 to 65%. The  left ventricle has normal function. The left ventricle has no regional  wall motion abnormalities. Left ventricular diastolic parameters are  indeterminate.   2. Right ventricular systolic function is normal. The right ventricular  size is normal.   3. The mitral valve is normal in structure. No evidence of mitral valve  regurgitation. No evidence of mitral stenosis.   4. The aortic valve was not well visualized. There is  moderate  calcification of the aortic valve. There is moderate thickening of the  aortic valve. Aortic valve regurgitation is trivial. Aortic valve  sclerosis/calcification is present, without any  evidence of aortic stenosis.   5. The inferior vena cava is normal in size with greater than 50%  respiratory variability, suggesting right atrial pressure of 3 mmHg.     CHA2DS2-VASc Score = 3  The patient's score is based upon: CHF History: 0 HTN History: 0 Diabetes History: 0 Stroke History: 0 Vascular Disease History: 1 Age Score: 1 Gender Score: 1       ASSESSMENT AND PLAN: Paroxysmal Atrial Fibrillation (ICD10:  I48.0) The patient's CHA2DS2-VASc score is 3, indicating a 3.2% annual risk of stroke.   Patient appears to be maintaining SR. Continue Toprol  12.5 mg daily Continue Eliquis  5 mg BID Apple Watch for home monitoring.  Secondary Hypercoagulable State (ICD10:  D68.69) The patient is at significant risk for stroke/thromboembolism based upon her CHA2DS2-VASc Score of 3.  Continue Apixaban  (Eliquis ). No bleeding issues.   CAD Coronary calcium  test in [place] 2018, 14.5 with plaque in LAD and RCA No anginal symptoms   Follow up in the AF clinic in one year.    Myrtha Ates PA-C Afib Clinic [facility] [address] [place], [place] [address] [phone]

## 2023-12-07 DIAGNOSIS — D352 Benign neoplasm of pituitary gland: Secondary | ICD-10-CM | POA: Diagnosis not present

## 2024-02-26 DIAGNOSIS — I4891 Unspecified atrial fibrillation: Secondary | ICD-10-CM | POA: Diagnosis not present

## 2024-02-26 DIAGNOSIS — R7303 Prediabetes: Secondary | ICD-10-CM | POA: Diagnosis not present

## 2024-02-26 DIAGNOSIS — Z7901 Long term (current) use of anticoagulants: Secondary | ICD-10-CM | POA: Diagnosis not present

## 2024-02-26 DIAGNOSIS — E7800 Pure hypercholesterolemia, unspecified: Secondary | ICD-10-CM | POA: Diagnosis not present

## 2024-02-26 DIAGNOSIS — Z79899 Other long term (current) drug therapy: Secondary | ICD-10-CM | POA: Diagnosis not present

## 2024-03-06 DIAGNOSIS — Z0000 Encounter for general adult medical examination without abnormal findings: Secondary | ICD-10-CM | POA: Diagnosis not present

## 2024-03-06 DIAGNOSIS — K2970 Gastritis, unspecified, without bleeding: Secondary | ICD-10-CM | POA: Diagnosis not present

## 2024-03-06 DIAGNOSIS — G4700 Insomnia, unspecified: Secondary | ICD-10-CM | POA: Diagnosis not present

## 2024-03-06 DIAGNOSIS — E230 Hypopituitarism: Secondary | ICD-10-CM | POA: Diagnosis not present

## 2024-03-06 DIAGNOSIS — Z79899 Other long term (current) drug therapy: Secondary | ICD-10-CM | POA: Diagnosis not present

## 2024-03-06 DIAGNOSIS — E890 Postprocedural hypothyroidism: Secondary | ICD-10-CM | POA: Diagnosis not present

## 2024-03-06 DIAGNOSIS — H409 Unspecified glaucoma: Secondary | ICD-10-CM | POA: Diagnosis not present

## 2024-03-06 DIAGNOSIS — E7800 Pure hypercholesterolemia, unspecified: Secondary | ICD-10-CM | POA: Diagnosis not present

## 2024-03-11 DIAGNOSIS — Z1231 Encounter for screening mammogram for malignant neoplasm of breast: Secondary | ICD-10-CM | POA: Diagnosis not present

## 2024-04-02 DIAGNOSIS — K219 Gastro-esophageal reflux disease without esophagitis: Secondary | ICD-10-CM | POA: Diagnosis not present

## 2024-04-02 DIAGNOSIS — K2970 Gastritis, unspecified, without bleeding: Secondary | ICD-10-CM | POA: Diagnosis not present

## 2024-04-04 DIAGNOSIS — H401221 Low-tension glaucoma, left eye, mild stage: Secondary | ICD-10-CM | POA: Diagnosis not present

## 2024-04-04 DIAGNOSIS — H401212 Low-tension glaucoma, right eye, moderate stage: Secondary | ICD-10-CM | POA: Diagnosis not present

## 2024-04-04 DIAGNOSIS — H2513 Age-related nuclear cataract, bilateral: Secondary | ICD-10-CM | POA: Diagnosis not present

## 2024-07-04 ENCOUNTER — Telehealth (HOSPITAL_BASED_OUTPATIENT_CLINIC_OR_DEPARTMENT_OTHER): Payer: Self-pay

## 2024-07-04 NOTE — Telephone Encounter (Signed)
° °  Name: [name]  DOB: July 03, 1956  MRN: [id number]  Primary Cardiologist: Alvan Ronal BRAVO, MD (Inactive)  Chart reviewed as part of pre-operative protocol coverage. Because of [name]'s past medical history and time since last visit, she will require a follow-up in-office visit in order to better assess preoperative cardiovascular risk. Has only been seen by Afib clinic in the office.Procedure is scheduled for 07/15/2024.  Pre-op covering staff: - Please schedule appointment and call patient to inform them. If patient already had an upcoming appointment within acceptable timeframe, please add pre-op clearance to the appointment notes so provider is aware. - Please contact requesting surgeon's office via preferred method (i.e, phone, fax) to inform them of need for appointment prior to surgery.  Request for pharmacy recommendations for Eliquis  hold.    Lamarr Satterfield, NP  07/04/2024, 9:28 AM

## 2024-07-04 NOTE — Telephone Encounter (Signed)
° °  Pre-operative Risk Assessment    Patient Name: [name]  DOB: 09-01-1955 MRN: [id number]   Date of last office visit: 01/09/23 with Dr. Alvan Date of next office visit: NA  Request for Surgical Clearance    Procedure:  EGD  Date of Surgery:  Clearance 07/15/24                                  Surgeon:  Dr. Rollin Socks Group or Practice Name:  [facility] Phone number:  [phone] Fax number:  [phone]   Type of Clearance Requested:   - Medical  - Pharmacy:  Hold Apixaban  (Eliquis ) not indicated   Type of Anesthesia:  Propofol   Additional requests/questions:    [name] [name]   07/04/2024, 9:22 AM

## 2024-07-04 NOTE — Telephone Encounter (Signed)
 Patient with diagnosis of A Fib on Eliquis  for anticoagulation.    Procedure: egd Date of procedure: 07/15/24   CHA2DS2-VASc Score = 3  This indicates a 3.2% annual risk of stroke. The patient's score is based upon: CHF History: 0 HTN History: 0 Diabetes History: 0 Stroke History: 0 Vascular Disease History: 1 Age Score: 1 Gender Score: 1    CrCl 78 ml/min Platelet count 225K  Patient has not had an Afib/aflutter ablation in the last 3 months, DCCV within the last 4 weeks or a watchman implanted in the last 45 days    Per office protocol, patient can hold Eliquis  for 2 days prior to procedure.    **This guidance is not considered finalized until pre-operative APP has relayed final recommendations.**

## 2024-07-04 NOTE — Telephone Encounter (Signed)
 Pharmacy please advise on holding Eliquis  prior to EDG scheduled for 07/15/2024. Last labs 02/26/2024. Thank you.

## 2024-07-07 NOTE — Telephone Encounter (Signed)
 Called pt through [employer] [phone].

## 2024-07-07 NOTE — Telephone Encounter (Signed)
 S/w the pt's son (DPR) who has scheduled appt in our [place] office as no openings in [place]. Pt has appt 07/10/24 @ 10:55 Bernardino Bring, PAC.   Will update all parties involved.

## 2024-07-07 NOTE — Telephone Encounter (Signed)
Son was returning call. Please advise  

## 2024-07-07 NOTE — Telephone Encounter (Addendum)
 Woman answered on the primary # for the pt, she said she is pt's cousin and we can reach the pt at [phone]. The cousin said she helps to schedule the appts for the pt, but the pt was not with her right now.    [employer] called the [phone] though pt did not answer.   Will update the requesting office Pt will need to call back and schedule an appt in the office for preop clearance  Looks like pt was seen by Dr. Kate in the hospital. Pt can be seen by APP.

## 2024-07-09 NOTE — Progress Notes (Unsigned)
 Cardiology Office Note    Date:  07/10/2024   ID:  [name], DOB Nov 05, 1955, MRN [id number]  PCP:  Gerome Brunet, DO  Cardiologist:  Alvan Ronal BRAVO, MD (Inactive)  Electrophysiologist:  None   Chief Complaint: Preprocedure cardiac risk stratification  History of Present Illness:   [name] is a 68 y.o. female with history of coronary artery calcification noted on CT imaging, PAF on apixaban , pituitary macroadenoma, postoperative hypothyroidism, aortic atherosclerosis, HLD, gastritis, and IBS who presents for preprocedure cardiac risk stratification for EGD scheduled on 07/15/2024.  CT of the chest in 2019 showed coronary artery calcification involving the LAD territory.  She apparently had a coronary CT done in [place] in 2018 with a score of 14.5 with calcification in the LAD and RCA with nonobstructive disease and further details unclear.  She was diagnosed with new onset A-fib in 2023 after presenting to urgent care and ED with chest pain and was found to be in A-fib with RVR.  She converted to sinus rhythm with metoprolol  and was placed on apixaban .  With improvement in rate and rhythm chest pain resolved.  Echo in 03/2022 showed an EF of 60 to 65%, no regional wall motion abnormalities, normal RV systolic function and ventricular cavity size, moderate sclerosis of the aortic valve with trivial insufficiency and no evidence of stenosis, and a normal CVP.  She subsequently underwent craniotomy and transsphenoidal resection/pituitary tumor without cardiac complication.  She has not had documented recurrence of A-fib and follow-up visits with A-fib clinic.  She was evaluated for preoperative cardiac risk stratification in 03/2023 virtually for EGD and felt to be acceptable risk for procedure.  She was most recently seen by the A-fib clinic in 11/2023 and was doing well from a cardiac perspective, maintaining sinus rhythm with recommendation to continue Toprol -XL 12.5 mg daily and  apixaban  5 mg twice daily.  She is scheduled for EGD on 07/15/2024, and presents today for cardiac risk stratification.  She is doing very well from a cardiac perspective and is without symptoms of angina or cardiac decompensation.  She notes rare sporadic palpitations that are overall well-controlled.  No dizziness, presyncope, or syncope.  While in [place] several months ago she did have 2 mechanical falls without LOC, syncope, or head trauma.  No hematochezia or melena.  Active at baseline, doing yard work without cardiac limitation.  No lower extremity swelling, abdominal distention, orthopnea, or early satiety.  Patient or family do not have any acute cardiac concerns at this time.   Our clinical pharmacy team has reviewed the patient's chart with recommendation for patient to hold apixaban  2 days prior to procedure.  DASI: > 4 METs RCRI:Low risk for noncardiac procedure   Labs independently reviewed: 05/2024 - TSH normal, BUN 20, serum creatinine 0.6, potassium 4.0 02/2024 - Hgb 14.1, PLT 225, albumin 4.2, AST/ALT normal, TC 247, TG 115, HDL 56, LDL 171, A1c 5.6  Past Medical History:  Diagnosis Date   Helicobacter pylori gastritis 2001   IBS (irritable bowel syndrome)    Multinodular goiter 2001   Hurtle cell changes on FNA   Pituitary abnormality 1990   Enlarged sella; Dr Alix    Past Surgical History:  Procedure Laterality Date   APPENDECTOMY     COLONOSCOPY W/ POLYPECTOMY  2010   [facility] ( as per her son)but possibly in [place] 2011   THYROIDECTOMY  2002   Hurthle  cell changes on biopsy; TSH goal = < 0.3  Current Medications: Active Medications[1]  Allergies:   Patient has no known allergies.   Social History   Socioeconomic History   Marital status: Married    Spouse name: mun sic cho   Number of children: 1   Years of education: Not on file   Highest education level: Not on file  Occupational History   Not on file  Tobacco Use   Smoking status: Never    Smokeless tobacco: Never   Tobacco comments:    Never smoked 12/06/23  Vaping Use   Vaping status: Not on file  Substance and Sexual Activity   Alcohol use: No   Drug use: No   Sexual activity: Yes    Partners: Female  Other Topics Concern   Not on file  Social History Narrative   Not on file   Social Drivers of Health   Tobacco Use: Low Risk (07/10/2024)   Patient History    Smoking Tobacco Use: Never    Smokeless Tobacco Use: Never    Passive Exposure: Not on file  Financial Resource Strain: Low Risk (06/14/2024)   Received from [facility]   Overall Financial Resource Strain (CARDIA)    How hard is it for you to pay for the very basics like food, housing, medical care, and heating?: Not very hard  Food Insecurity: No Food Insecurity (06/14/2024)   Received from [facility]   Epic    Within the past 12 months, you worried that your food would run out before you got the money to buy more.: Never true    Within the past 12 months, the food you bought just didn't last and you didn't have money to get more.: Never true  Transportation Needs: No Transportation Needs (06/14/2024)   Received from [facility]    In the past 12 months, has lack of transportation kept you from medical appointments or from getting medications?: No    In the past 12 months, has lack of transportation kept you from meetings, work, or from getting things needed for daily living?: No  Physical Activity: Unknown (06/14/2024)   Received from [facility]   Exercise Vital Sign    On average, how many days per week do you engage in moderate to strenuous exercise (like a brisk walk)?: 4 days    Minutes of Exercise per Session: Not on file  Stress: No Stress Concern Present (06/14/2024)   Received from [facility] of Occupational Health - Occupational Stress Questionnaire    Do you feel stress - tense, restless, nervous, or anxious, or unable to sleep at night because  your mind is troubled all the time - these days?: Not at all  Social Connections: Moderately Integrated (06/14/2024)   Received from [facility]   Social Network    How would you rate your social network (family, work, friends)?: Adequate participation with social networks  Depression (PHQ2-9): Not on file  Alcohol Screen: Not on file  Housing: Unknown (06/14/2024)   Received from [facility]    In the last 12 months, was there a time when you were not able to pay the mortgage or rent on time?: No    Number of Times Moved in the Last Year: Not on file    At any time in the past 12 months, were you homeless or living in a shelter (including now)?: No  Utilities: Not At Risk (06/14/2024)   Received from [facility]  Epic    In the past 12 months has the electric, gas, oil, or water company threatened to shut off services in your home?: No  Health Literacy: Not on file     Family History:  The patient's family history is negative for Heart disease, Stroke, and Diabetes.  ROS:   12-point review of systems is negative unless otherwise noted in the HPI.   EKGs/Labs/Other Studies Reviewed:    Studies reviewed were summarized above. The additional studies were reviewed today:  2D echo 04/21/2022: 1. Left ventricular ejection fraction, by estimation, is 60 to 65%. The  left ventricle has normal function. The left ventricle has no regional  wall motion abnormalities. Left ventricular diastolic parameters are  indeterminate.   2. Right ventricular systolic function is normal. The right ventricular  size is normal.   3. The mitral valve is normal in structure. No evidence of mitral valve  regurgitation. No evidence of mitral stenosis.   4. The aortic valve was not well visualized. There is moderate  calcification of the aortic valve. There is moderate thickening of the  aortic valve. Aortic valve regurgitation is trivial. Aortic valve  sclerosis/calcification is present,  without any  evidence of aortic stenosis.   5. The inferior vena cava is normal in size with greater than 50%  respiratory variability, suggesting right atrial pressure of 3 mmHg.     EKG:  EKG is ordered today.  The EKG ordered today demonstrates sinus bradycardia, 51 bpm, anterolateral TWI, consistent with prior tracings dating back to 2023  Recent Labs: No results found for requested labs within last 365 days.  Recent Lipid Panel    Component Value Date/Time   CHOL 203 (H) 07/11/2019 0843   TRIG 159.0 (H) 07/11/2019 0843   HDL 52.10 07/11/2019 0843   CHOLHDL 4 07/11/2019 0843   VLDL 31.8 07/11/2019 0843   LDLCALC 120 (H) 07/11/2019 0843   LDLDIRECT 57.0 06/04/2018 1348    PHYSICAL EXAM:    VS:  BP 126/80 (BP Location: Left Arm, Patient Position: Sitting, Cuff Size: Normal)   Pulse (!) 51   Ht 5' 3.78 (1.62 m)   Wt 128 lb (58.1 kg)   LMP 10/08/2001   SpO2 98%   BMI 22.12 kg/m   BMI: Body mass index is 22.12 kg/m.  Physical Exam Vitals reviewed.  Constitutional:      Appearance: She is well-developed.  HENT:     Head: Normocephalic and atraumatic.  Eyes:     General:        Right eye: No discharge.        Left eye: No discharge.  Cardiovascular:     Rate and Rhythm: Regular rhythm. Bradycardia present.     Heart sounds: Normal heart sounds, S1 normal and S2 normal. Heart sounds not distant. No midsystolic click and no opening snap. No murmur heard.    No friction rub.  Pulmonary:     Effort: Pulmonary effort is normal. No respiratory distress.     Breath sounds: Normal breath sounds. No decreased breath sounds, wheezing, rhonchi or rales.  Musculoskeletal:     Cervical back: Normal range of motion.     Right lower leg: No edema.     Left lower leg: No edema.  Skin:    General: Skin is warm and dry.     Nails: There is no clubbing.  Neurological:     Mental Status: She is alert and oriented to person, place, and time.  Psychiatric:  Speech: Speech  normal.        Behavior: Behavior normal.        Thought Content: Thought content normal.        Judgment: Judgment normal.     Wt Readings from Last 3 Encounters:  07/10/24 128 lb (58.1 kg)  12/06/23 123 lb 12.8 oz (56.2 kg)  01/09/23 127 lb 3.2 oz (57.7 kg)     ASSESSMENT & PLAN:   Preprocedure cardiac restratification: She is scheduled for EGD on 07/15/2024.  Per DASI, she can achieve > 4 METs without cardiac limitation.  Per RCI, she is low risk for EGD.  Clinical pharmacy team recommends holding of apixaban  2 days prior to EGD with recommendation to resume as soon as safely possible postprocedure at the discretion of performing MD.  She may proceed at an overall low risk without further cardiac testing.  PAF: Maintaining sinus rhythm with a bradycardic rate and is asymptomatic.  Remains on Toprol -XL 12.5 mg daily.  CHA2DS2-VASc at least 3 (age x 1, vascular disease, sex category).  On apixaban  5 mg twice daily with stable labs.  Ongoing management per A-fib clinic.  Coronary artery calcification/aortic atherosclerosis/HLD: LDL 171 in 02/2023.  Has not been maintained on statin.  Target LDL less than 70.  No symptoms suggestive of coronary insufficiency.  Recommend discussion of further risk factor modification with escalation of pharmacotherapy at the discretion of A-fib clinic or PCP.  On apixaban  in lieu of aspirin.  Language barrier: [facility] medical interpreter utilized for today's visit.     Disposition: F/u with A-fib clinic as directed.   Medication Adjustments/Labs and Tests Ordered: Current medicines are reviewed at length with the patient today.  Concerns regarding medicines are outlined above. Medication changes, Labs and Tests ordered today are summarized above and listed in the Patient Instructions accessible in Encounters.   SignedBernardino Bring, PA-C 07/10/2024 12:01 PM     [facility] [place] [address] [place], [place]  [address] [phone]     [1]  Current Meds  Medication Sig   alendronate (FOSAMAX) 70 MG tablet Take 70 mg by mouth once a week.   apixaban  (ELIQUIS ) 5 MG TABS tablet Take 1 tablet (5 mg total) by mouth 2 (two) times daily.   Ascorbic Acid (VITAMIN C PO) Take 1 tablet by mouth daily.   cholecalciferol (VITAMIN D ) 1000 units tablet Take 1 tablet (1,000 Units total) by mouth daily.   latanoprost  (XALATAN ) 0.005 % ophthalmic solution 1 drop at bedtime.   levothyroxine  (SYNTHROID ) 88 MCG tablet Take 1 tablet (88 mcg total) by mouth daily.   metoprolol  succinate (TOPROL  XL) 25 MG 24 hr tablet Take 0.5 tablets (12.5 mg total) by mouth at bedtime.   omeprazole (PRILOSEC) 40 MG capsule Take 40 mg by mouth as needed (stomach upset).   predniSONE  (DELTASONE ) 10 MG tablet Take 5 mg by mouth in the morning and at bedtime.   sucralfate (CARAFATE) 1 g tablet 1 tablet on an empty stomach Orally Twice a day as needed   timolol (TIMOPTIC) 0.5 % ophthalmic solution Place 1 drop into the left eye every morning.   traZODone (DESYREL) 50 MG tablet at bedtime as needed.

## 2024-07-10 ENCOUNTER — Ambulatory Visit: Attending: Physician Assistant | Admitting: Physician Assistant

## 2024-07-10 ENCOUNTER — Encounter: Payer: Self-pay | Admitting: Physician Assistant

## 2024-07-10 VITALS — BP 126/80 | HR 51 | Ht 63.78 in | Wt 128.0 lb

## 2024-07-10 DIAGNOSIS — Z758 Other problems related to medical facilities and other health care: Secondary | ICD-10-CM | POA: Diagnosis not present

## 2024-07-10 DIAGNOSIS — E785 Hyperlipidemia, unspecified: Secondary | ICD-10-CM

## 2024-07-10 DIAGNOSIS — Z01810 Encounter for preprocedural cardiovascular examination: Secondary | ICD-10-CM

## 2024-07-10 DIAGNOSIS — I2510 Atherosclerotic heart disease of native coronary artery without angina pectoris: Secondary | ICD-10-CM

## 2024-07-10 DIAGNOSIS — Z603 Acculturation difficulty: Secondary | ICD-10-CM | POA: Diagnosis not present

## 2024-07-10 DIAGNOSIS — I700 Atherosclerosis of aorta: Secondary | ICD-10-CM

## 2024-07-10 DIAGNOSIS — I480 Paroxysmal atrial fibrillation: Secondary | ICD-10-CM | POA: Diagnosis not present

## 2024-07-10 NOTE — Patient Instructions (Signed)
 Medication Instructions:  Your physician recommends that you continue on your current medications as directed. Please refer to the Current Medication list given to you today.    Ok to hold Eliquis  3 days prior to EGD, then resume thereafter.  *If you need a refill on your cardiac medications before your next appointment, please call your pharmacy*  Lab Work: No labs ordered today    Testing/Procedures: No test ordered today   Follow-Up: At [facility], you and your health needs are our priority.  As part of our continuing mission to provide you with exceptional heart care, our providers are all part of one team.  This team includes your primary Cardiologist (physician) and Advanced Practice Providers or APPs (Physician Assistants and Nurse Practitioners) who all work together to provide you with the care you need, when you need it.  Your next appointment:   Follow up with [facility] clinic as scheduled

## 2024-12-05 ENCOUNTER — Ambulatory Visit (HOSPITAL_COMMUNITY): Admitting: Physician Assistant
